# Patient Record
Sex: Female | Born: 1970 | Race: White | Hispanic: Yes | Marital: Married | Smoking: Never smoker
Health system: Southern US, Community
[De-identification: ages and names within clinical notes are randomized; demographics above are authoritative.]

## PROBLEM LIST (undated history)

## (undated) DIAGNOSIS — J45909 Unspecified asthma, uncomplicated: Secondary | ICD-10-CM

## (undated) DIAGNOSIS — M549 Dorsalgia, unspecified: Secondary | ICD-10-CM

## (undated) DIAGNOSIS — E119 Type 2 diabetes mellitus without complications: Secondary | ICD-10-CM

## (undated) DIAGNOSIS — I10 Essential (primary) hypertension: Secondary | ICD-10-CM

## (undated) HISTORY — PX: HERNIA REPAIR: SHX51

---

## 1998-12-13 ENCOUNTER — Emergency Department (HOSPITAL_COMMUNITY): Admission: EM | Admit: 1998-12-13 | Discharge: 1998-12-13 | Payer: Self-pay | Admitting: Emergency Medicine

## 1998-12-24 ENCOUNTER — Emergency Department (HOSPITAL_COMMUNITY): Admission: EM | Admit: 1998-12-24 | Discharge: 1998-12-24 | Payer: Self-pay | Admitting: Emergency Medicine

## 1999-02-22 ENCOUNTER — Ambulatory Visit: Admission: RE | Admit: 1999-02-22 | Discharge: 1999-02-22 | Payer: Self-pay | Admitting: *Deleted

## 1999-02-22 ENCOUNTER — Encounter: Payer: Self-pay | Admitting: *Deleted

## 1999-03-06 ENCOUNTER — Emergency Department (HOSPITAL_COMMUNITY): Admission: EM | Admit: 1999-03-06 | Discharge: 1999-03-07 | Payer: Self-pay | Admitting: Emergency Medicine

## 1999-03-06 ENCOUNTER — Encounter: Payer: Self-pay | Admitting: Emergency Medicine

## 1999-07-19 ENCOUNTER — Emergency Department (HOSPITAL_COMMUNITY): Admission: EM | Admit: 1999-07-19 | Discharge: 1999-07-19 | Payer: Self-pay | Admitting: *Deleted

## 1999-09-17 ENCOUNTER — Emergency Department (HOSPITAL_COMMUNITY): Admission: EM | Admit: 1999-09-17 | Discharge: 1999-09-17 | Payer: Self-pay | Admitting: Emergency Medicine

## 1999-09-18 ENCOUNTER — Encounter: Payer: Self-pay | Admitting: Emergency Medicine

## 2000-04-22 ENCOUNTER — Inpatient Hospital Stay (HOSPITAL_COMMUNITY): Admission: AD | Admit: 2000-04-22 | Discharge: 2000-04-22 | Payer: Self-pay | Admitting: Gynecology

## 2000-05-08 ENCOUNTER — Encounter: Payer: Self-pay | Admitting: *Deleted

## 2000-05-08 ENCOUNTER — Encounter: Payer: Self-pay | Admitting: Obstetrics

## 2000-05-08 ENCOUNTER — Inpatient Hospital Stay (HOSPITAL_COMMUNITY): Admission: AD | Admit: 2000-05-08 | Discharge: 2000-05-10 | Payer: Self-pay | Admitting: Gynecology

## 2000-05-14 ENCOUNTER — Other Ambulatory Visit: Admission: RE | Admit: 2000-05-14 | Discharge: 2000-05-14 | Payer: Self-pay | Admitting: Obstetrics

## 2000-06-11 ENCOUNTER — Other Ambulatory Visit: Admission: RE | Admit: 2000-06-11 | Discharge: 2000-06-11 | Payer: Self-pay | Admitting: Internal Medicine

## 2000-07-26 ENCOUNTER — Inpatient Hospital Stay (HOSPITAL_COMMUNITY): Admission: AD | Admit: 2000-07-26 | Discharge: 2000-07-26 | Payer: Self-pay | Admitting: Obstetrics & Gynecology

## 2000-09-08 ENCOUNTER — Inpatient Hospital Stay (HOSPITAL_COMMUNITY): Admission: AD | Admit: 2000-09-08 | Discharge: 2000-09-08 | Payer: Self-pay | Admitting: *Deleted

## 2000-09-12 ENCOUNTER — Inpatient Hospital Stay (HOSPITAL_COMMUNITY): Admission: AD | Admit: 2000-09-12 | Discharge: 2000-09-12 | Payer: Self-pay | Admitting: *Deleted

## 2000-10-17 ENCOUNTER — Inpatient Hospital Stay (HOSPITAL_COMMUNITY): Admission: AD | Admit: 2000-10-17 | Discharge: 2000-10-17 | Payer: Self-pay | Admitting: Gynecology

## 2000-10-18 ENCOUNTER — Inpatient Hospital Stay (HOSPITAL_COMMUNITY): Admission: AD | Admit: 2000-10-18 | Discharge: 2000-10-18 | Payer: Self-pay | Admitting: Internal Medicine

## 2000-10-25 ENCOUNTER — Inpatient Hospital Stay (HOSPITAL_COMMUNITY): Admission: AD | Admit: 2000-10-25 | Discharge: 2000-10-27 | Payer: Self-pay | Admitting: Gynecology

## 2000-11-30 ENCOUNTER — Encounter: Payer: Self-pay | Admitting: Emergency Medicine

## 2000-11-30 ENCOUNTER — Emergency Department (HOSPITAL_COMMUNITY): Admission: EM | Admit: 2000-11-30 | Discharge: 2000-11-30 | Payer: Self-pay | Admitting: Emergency Medicine

## 2000-12-17 ENCOUNTER — Other Ambulatory Visit: Admission: RE | Admit: 2000-12-17 | Discharge: 2000-12-17 | Payer: Self-pay | Admitting: Gynecology

## 2001-03-26 ENCOUNTER — Emergency Department (HOSPITAL_COMMUNITY): Admission: EM | Admit: 2001-03-26 | Discharge: 2001-03-26 | Payer: Self-pay | Admitting: Emergency Medicine

## 2001-03-26 ENCOUNTER — Encounter: Payer: Self-pay | Admitting: Emergency Medicine

## 2001-11-12 ENCOUNTER — Encounter: Admission: RE | Admit: 2001-11-12 | Discharge: 2001-11-12 | Payer: Self-pay | Admitting: Gynecology

## 2001-11-12 ENCOUNTER — Encounter: Payer: Self-pay | Admitting: Gynecology

## 2001-11-21 ENCOUNTER — Ambulatory Visit (HOSPITAL_COMMUNITY): Admission: RE | Admit: 2001-11-21 | Discharge: 2001-11-21 | Payer: Self-pay | Admitting: Gynecology

## 2001-11-21 ENCOUNTER — Encounter: Payer: Self-pay | Admitting: Gynecology

## 2002-01-31 ENCOUNTER — Emergency Department (HOSPITAL_COMMUNITY): Admission: EM | Admit: 2002-01-31 | Discharge: 2002-01-31 | Payer: Self-pay

## 2002-02-10 ENCOUNTER — Emergency Department (HOSPITAL_COMMUNITY): Admission: EM | Admit: 2002-02-10 | Discharge: 2002-02-11 | Payer: Self-pay | Admitting: Emergency Medicine

## 2002-02-11 ENCOUNTER — Encounter: Payer: Self-pay | Admitting: Emergency Medicine

## 2002-03-11 ENCOUNTER — Other Ambulatory Visit: Admission: RE | Admit: 2002-03-11 | Discharge: 2002-03-11 | Payer: Self-pay | Admitting: Gynecology

## 2002-11-05 ENCOUNTER — Other Ambulatory Visit: Admission: RE | Admit: 2002-11-05 | Discharge: 2002-11-05 | Payer: Self-pay | Admitting: Gynecology

## 2002-11-13 HISTORY — PX: TUBAL LIGATION: SHX77

## 2003-03-02 ENCOUNTER — Inpatient Hospital Stay (HOSPITAL_COMMUNITY): Admission: AD | Admit: 2003-03-02 | Discharge: 2003-03-02 | Payer: Self-pay | Admitting: Gynecology

## 2003-04-05 ENCOUNTER — Inpatient Hospital Stay (HOSPITAL_COMMUNITY): Admission: AD | Admit: 2003-04-05 | Discharge: 2003-04-06 | Payer: Self-pay | Admitting: Gynecology

## 2003-04-18 ENCOUNTER — Inpatient Hospital Stay (HOSPITAL_COMMUNITY): Admission: AD | Admit: 2003-04-18 | Discharge: 2003-04-20 | Payer: Self-pay | Admitting: Gynecology

## 2003-05-03 ENCOUNTER — Inpatient Hospital Stay (HOSPITAL_COMMUNITY): Admission: AD | Admit: 2003-05-03 | Discharge: 2003-05-05 | Payer: Self-pay | Admitting: Gynecology

## 2003-05-04 ENCOUNTER — Encounter (INDEPENDENT_AMBULATORY_CARE_PROVIDER_SITE_OTHER): Payer: Self-pay | Admitting: *Deleted

## 2003-07-14 ENCOUNTER — Other Ambulatory Visit: Admission: RE | Admit: 2003-07-14 | Discharge: 2003-07-14 | Payer: Self-pay | Admitting: Gynecology

## 2004-04-21 ENCOUNTER — Emergency Department (HOSPITAL_COMMUNITY): Admission: EM | Admit: 2004-04-21 | Discharge: 2004-04-21 | Payer: Self-pay | Admitting: Emergency Medicine

## 2005-04-14 ENCOUNTER — Emergency Department (HOSPITAL_COMMUNITY): Admission: EM | Admit: 2005-04-14 | Discharge: 2005-04-14 | Payer: Self-pay | Admitting: Emergency Medicine

## 2005-11-09 ENCOUNTER — Emergency Department (HOSPITAL_COMMUNITY): Admission: EM | Admit: 2005-11-09 | Discharge: 2005-11-10 | Payer: Self-pay | Admitting: Emergency Medicine

## 2006-02-02 ENCOUNTER — Emergency Department (HOSPITAL_COMMUNITY): Admission: EM | Admit: 2006-02-02 | Discharge: 2006-02-03 | Payer: Self-pay | Admitting: Emergency Medicine

## 2006-04-13 ENCOUNTER — Inpatient Hospital Stay (HOSPITAL_COMMUNITY): Admission: AD | Admit: 2006-04-13 | Discharge: 2006-04-14 | Payer: Self-pay | Admitting: Obstetrics and Gynecology

## 2007-02-27 ENCOUNTER — Emergency Department (HOSPITAL_COMMUNITY): Admission: EM | Admit: 2007-02-27 | Discharge: 2007-02-27 | Payer: Self-pay | Admitting: Emergency Medicine

## 2007-02-28 ENCOUNTER — Ambulatory Visit: Payer: Self-pay | Admitting: Vascular Surgery

## 2007-02-28 ENCOUNTER — Encounter: Payer: Self-pay | Admitting: Vascular Surgery

## 2007-02-28 ENCOUNTER — Ambulatory Visit (HOSPITAL_COMMUNITY): Admission: RE | Admit: 2007-02-28 | Discharge: 2007-02-28 | Payer: Self-pay | Admitting: Physician Assistant

## 2008-03-31 ENCOUNTER — Ambulatory Visit: Payer: Self-pay | Admitting: Family Medicine

## 2008-03-31 ENCOUNTER — Encounter (INDEPENDENT_AMBULATORY_CARE_PROVIDER_SITE_OTHER): Payer: Self-pay | Admitting: Family Medicine

## 2008-03-31 ENCOUNTER — Ambulatory Visit (HOSPITAL_COMMUNITY): Admission: RE | Admit: 2008-03-31 | Discharge: 2008-03-31 | Payer: Self-pay | Admitting: Family Medicine

## 2008-03-31 LAB — CONVERTED CEMR LAB
ALT: 18 units/L (ref 0–35)
BUN: 11 mg/dL (ref 6–23)
CO2: 27 meq/L (ref 19–32)
Calcium: 9.2 mg/dL (ref 8.4–10.5)
Chloride: 105 meq/L (ref 96–112)
Cholesterol: 223 mg/dL — ABNORMAL HIGH (ref 0–200)
Creatinine, Ser: 0.6 mg/dL (ref 0.40–1.20)
Eosinophils Relative: 5 % (ref 0–5)
HCT: 41.6 % (ref 36.0–46.0)
Hemoglobin: 13.3 g/dL (ref 12.0–15.0)
Lymphocytes Relative: 28 % (ref 12–46)
Lymphs Abs: 2.5 10*3/uL (ref 0.7–4.0)
Monocytes Absolute: 0.6 10*3/uL (ref 0.1–1.0)
Monocytes Relative: 6 % (ref 3–12)
RBC: 4.48 M/uL (ref 3.87–5.11)
Sed Rate: 18 mm/hr (ref 0–22)
Total CHOL/HDL Ratio: 4.1
WBC: 8.9 10*3/uL (ref 4.0–10.5)

## 2008-05-04 ENCOUNTER — Ambulatory Visit: Payer: Self-pay | Admitting: *Deleted

## 2008-05-04 ENCOUNTER — Ambulatory Visit: Payer: Self-pay | Admitting: Family Medicine

## 2009-09-06 ENCOUNTER — Encounter: Admission: RE | Admit: 2009-09-06 | Discharge: 2009-09-06 | Payer: Self-pay | Admitting: Specialist

## 2009-11-13 HISTORY — PX: FINGER SURGERY: SHX640

## 2010-05-18 ENCOUNTER — Emergency Department (HOSPITAL_COMMUNITY): Admission: EM | Admit: 2010-05-18 | Discharge: 2010-05-18 | Payer: Self-pay | Admitting: Emergency Medicine

## 2010-05-26 ENCOUNTER — Ambulatory Visit (HOSPITAL_COMMUNITY): Admission: RE | Admit: 2010-05-26 | Discharge: 2010-05-26 | Payer: Self-pay | Admitting: Orthopedic Surgery

## 2010-05-30 ENCOUNTER — Encounter: Admission: RE | Admit: 2010-05-30 | Discharge: 2010-05-30 | Payer: Self-pay | Admitting: Orthopedic Surgery

## 2010-08-25 ENCOUNTER — Ambulatory Visit: Payer: Self-pay | Admitting: Obstetrics and Gynecology

## 2010-08-25 ENCOUNTER — Inpatient Hospital Stay (HOSPITAL_COMMUNITY): Admission: AD | Admit: 2010-08-25 | Discharge: 2010-08-25 | Payer: Self-pay | Admitting: Obstetrics & Gynecology

## 2011-01-26 LAB — WET PREP, GENITAL
Clue Cells Wet Prep HPF POC: NONE SEEN
Trich, Wet Prep: NONE SEEN
Yeast Wet Prep HPF POC: NONE SEEN

## 2011-01-26 LAB — URINALYSIS, ROUTINE W REFLEX MICROSCOPIC
Bilirubin Urine: NEGATIVE
Nitrite: NEGATIVE
Specific Gravity, Urine: <1.005 — ABNORMAL LOW (ref 1.005–1.030)
pH: 6 (ref 5.0–8.0)

## 2011-01-26 LAB — GC/CHLAMYDIA PROBE AMP, GENITAL
Chlamydia, DNA Probe: NEGATIVE
GC Probe Amp, Genital: NEGATIVE

## 2011-01-26 LAB — POCT PREGNANCY, URINE: Preg Test, Ur: NEGATIVE

## 2011-03-31 NOTE — Discharge Summary (Signed)
   NAME:  [name], [name]                       ACCOUNT NO.:  [id number]   MEDICAL RECORD NO.:  [id number]                   PATIENT TYPE:  INP   LOCATION:  9152                                 FACILITY:  [facility]   PHYSICIAN:  Timothy P. Fontaine, M.D.           DATE OF BIRTH:  1971/01/08   DATE OF ADMISSION:  04/05/2003  DATE OF DISCHARGE:  04/06/2003                                 DISCHARGE SUMMARY   DISCHARGE DIAGNOSES:  1. Intrauterine pregnancy at 33+ weeks, undelivered.  2. Preterm contractions.   HISTORY:  This is a 40 years of age female, gravida 8, para 3, with a  history of preterm labor and preterm contractions.  During pregnancy, she  was already placed on p.o. terbutaline.   HOSPITAL COURSE:  On Apr 05, 2003, the patient was admitted at 33 weeks.  Already on p.o. terbutaline with contractions q.10-12 minutes.  Cervix is  fingertip, long and high.  Admitted for IV hydration, continuation of oral  terbutaline with observation.  Throughout the remainder of her evening,  contractions resolved with IV fluids and oral terbutaline 2.5 q.4 h.   DISPOSITION:  The patient was discharged to home in stable and satisfactory  condition on terbutaline 2.5 q.4 h.  She is to followup in the office in 48  hours at her scheduled appointment.  Strongly advised to stop working at  this point.  If any problems prior to that time, to let us know.     Leonidas Romberg., [place], P.A.-C                 Nadyne Coombes. Audie Box, M.D.    Ardath Sax  D:  05/15/2003  T:  05/15/2003  Job:  [id number]

## 2011-03-31 NOTE — Op Note (Signed)
NAME:  [name], [name]                       ACCOUNT NO.:  [id number]   MEDICAL RECORD NO.:  [id number]                   PATIENT TYPE:  INP   LOCATION:  9130                                 [facility]ACILITY[facility]   PHYSICIAN:  Juan H. Lily Peer, M.D.             DATE OF BIRTH:  05-06-1971   DATE OF PROCEDURE:  05/04/2003  DATE OF DISCHARGE:                                 OPERATIVE REPORT   PREOPERATIVE DIAGNOSIS:  Request for immediate postpartum tubal  sterilization.   POSTOPERATIVE DIAGNOSIS:  Request for immediate postpartum tubal  sterilization.   PROCEDURE PERFORMED:  Postpartum tubal sterilization procedure, Pomeroy  technique.   ANESTHESIA:  Epidural along with 0.25% Marcaine infiltrated subcutaneously.   INDICATION FOR OPERATION:  A 40 year old gravida 8, para 6, AB 2, who had  been counseled preoperatively as well as during her prenatal visits for  postpartum tubal sterilization.  The appropriate Medicaid forms were filled  out with the appropriate timetable.  The patient was counseled as to risks,  benefits, pros and cons, and fully aware of this being a permanent  procedure.  The following instructions had also been provided in Spanish as  well.   FINDINGS:  Patient with slightly inflamed right fallopian tube with  peritubal adhesions to the left tube.  Normal size with some peritubal  adhesions.   DESCRIPTION OF OPERATION:  After the patient was adequately counseled, she  was taken to the operating room, where she was redosed through her epidural.  The abdomen was prepped and draped in the usual sterile fashion.  Marcaine  0.25% for 10 mL was infiltrated in a subumbilical incision where the  incision was to be made.  A semilunar incision was made from the skin and  subcutaneous tissue down to the rectus fascia.  The rectus fascia was  incised, the peritoneal cavity was entered, and two [employer] retractors  were utilized for exposure along with the Deaver.  The left  tube was brought  into view with a weighted retractor and some of the peritubal adhesions had  to be freed in order to visualize the fallopian tube and was completely  identified from its proximal to its distal end.  Then the proximal portion  was grasped with a Babcock clamp and a 2 cm segment was tied with 3-0 Vicryl  suture x2 and a 2 cm segment was excised, passed off the operative field,  and the remaining stumps Bovie cauterized.  A similar procedure was carried  out on the contralateral side.  The sponge count and needle count were  correct.  The fascia was closed with a running stitch of 0 Vicryl suture and  the subcutaneous tissue was reapproximated with 3-0 Vicryl suture, and the  skin was reapproximated with subcuticular stitch of 4-0 plain catgut suture.  The patient was transferred to the recovery room with stable vital signs.  She did receive a gram of Cefotan for prophylaxis.  IV fluid total was 1100  mL of lactated Ringer's, and blood loss was minimal.                                               Juan H. Lily Peer, M.D.    JHF/MEDQ  D:  05/04/2003  T:  05/04/2003  Job:  [id number]

## 2011-03-31 NOTE — Discharge Summary (Signed)
[facility]  Patient:    [name], [name]                    MRN: [id number] Adm. Date:  [id number] Attending:  Tammi Sou Dictator:   Kinnie Scales. Reed Breech, M.D. CC:         Dr. Magnus Ivan                           Discharge Summary  DATE OF BIRTH:                01/21/1971.  DISCHARGE DIAGNOSES:          1. Right lower quadrant pain, rule out ___.                               2. Dehydration.                               3. A 13-week gravid female.  DISCHARGE MEDS:               1. Tylenol No. 3 one p.o. q.6h. p.r.n. pain.                               2. Prenatal vitamins one p.o. q.d.                               3. Tylenol 325 mg one to two p.o. q.4-6h. p.r.n.                                  pain.  PROCEDURES:                   Abdominal CT with p.o. contrast, no IV contrast, unable to demonstrate appendicitis or periappendicular inflammation.  No distended loops of bowel.  Essentially negative.  OB ultrasound performed. Please see ultrasound worksheet.  CONSULTATIONS:                Dr. Magnus Ivan, surgery.  ADMISSION HISTORY & PHYSICAL: This Latino 40 year old G4, P3-0-0-3 at 79 weeks estimated gestational age presented with onset of right lower quadrant pain around four in the afternoon.  The pain is intermittent, comes and goes, and also associated with right leg pain.  She also reports a headache and blurred vision.  She has been vomiting x 2.  She reports to have waves of nausea and decreased appetite.  She arrived via EMS.  The patient does have significant history of having appendix diagnosed in [place] and was treated with p.o. antibiotics.  PHYSICAL EXAMINATION:         She has a soft abdomen with positive bowel sounds. Abdomen is tender with palpation of right lower quadrant.  There is no guarding, rebound or psoas sign.  Wet prep showed few clue cells.  UA was negative.  CBC had a nonelevated wbc, hemoglobin  11.9.  PLAN:                         The patient is was admitted for evaluation of her abdominal pain.  HOSPITAL COURSE:              #  1 - ABDOMINAL PAIN.  Dr. Magnus Ivan and family practice resident evaluated the patient and found her pain to be more superficial than appendix plus her abdomen was nonacute.  CT did not demonstrate small-bowel obstruction or appendicitis.  The patient was having flatus and BM.  The patient also reported some paresthesias down her leg associated with this abdominal pain and there was a question of a neurologic component to this as well.  The patients abdominal pain had resolved by the day of discharge.  She is stable and ambulating.  She is having good bowel movements.                                #2 - FEN.  The patient did have some orthostatic symptoms and was pronounced as dizziness. She was given lactated Ringers fluid bolus. By the day of discharge these symptoms had resolved.  DISCHARGE HISTORY & PHYSICAL: The patient doing well today.  She wants to go home.  She said she is eating, getting up and moving around in a normal fashion.  She does ask for some stronger Tylenol to help with the pain over the next several days but otherwise will use Tylenol regular strength.  FOLLOW-UP:                    The patient is to follow her regular OB appointment with [facility] in the next week or so. DD:  05/10/00 TD:  05/10/00 Job: [id number]

## 2011-03-31 NOTE — H&P (Signed)
NAME:  [name], [name]                       ACCOUNT NO.:  [id number]   MEDICAL RECORD NO.:  [id number]                   PATIENT TYPE:  INP   LOCATION:  9156                                 [facility]ACILITY:  [facility]   PHYSICIAN:  Timothy P. Fontaine, M.D.           DATE OF BIRTH:  1971/09/19   DATE OF ADMISSION:  04/18/2003  DATE OF DISCHARGE:                                HISTORY & PHYSICAL   CHIEF COMPLAINT:  Contractions.   HISTORY OF PRESENT ILLNESS:  This is a 40 year old G8, P5 female at 34-5/7  weeks admitted with contractions. The patient has a history of preterm labor  for which she is on oral terbutaline 2.5 q.4 h. with last office check at 1  cm dilatation. The patient noted today, increasing contractions on a  frequent basis and she was instructed to present to triage. On triage  evaluation, she was contracting q.5-12 minutes. She was admitted for IV  hydration and continued terbutaline therapy. The patient does have a history  of a positive beta Streptococcus in the past.   PAST OBSTETRICAL HISTORY:  Significant for preterm rupture of the membranes  at 35 weeks with one of her pregnancies. The remainder delivered at 40  weeks.   PAST MEDICAL HISTORY:  Unremarkable.   PAST SURGICAL HISTORY:  Unremarkable.   ALLERGIES:  None.   REVIEW OF SYSTEMS:  Noncontributory.   SOCIAL HISTORY:  Noncontributory.   FAMILY HISTORY:  Noncontributory.   PHYSICAL EXAMINATION:  VITAL SIGNS:  Afebrile. Vital signs are stable.  HEENT:  Normal.  LUNGS:  Clear.  CARDIAC:  Regular rate without murmurs, rubs, or gallops.  ABDOMEN:  Gravid, vertex, appropriate for 34-35 week fundal height. Uterus  is soft, nontender. External monitor shows reactive fetal tracing with  contractions q.2-4 minutes.  PELVIC:  Shows cervix to be 1-2 cm, 40%, -2 station, vertex presentation.   ASSESSMENT:  This is a 40 year old G8, P5 with history of preterm labor, on  oral terbutaline, now with regular  contractions despite intravenous  hydration and continued terbutaline tocolysis. The patient is 55, almost 35  weeks. The issues of more aggressive tocolysis versus allowing delivery were  reviewed. Given the marginal gestational age, will air on conservative side  and proceed with magnesium tocolysis. Will plan full g bolus, 2 g a hour  maintenance. Discontinue the oral terbutaline. Treat with Unasyn 1.5 g q.6  h. Cover for Streptococcus. Check urinalysis, CBC, baseline.   Assuming the patient receives tocolysis, then will consider switching to  Nifedipine for oral dosage. If breakthrough, then will proceed with  delivery. Given gestational age at 49, almost 35 weeks, will hold on  steroids at this time. The patient's questions were answered to her  satisfaction with her husband present.  Timothy P. Audie Box, M.D.    TPF/MEDQ  D:  04/18/2003  T:  04/18/2003  Job:  [id number]

## 2011-03-31 NOTE — Discharge Summary (Signed)
[facility]  Patient:    [name], [name]                    MRN: [id number] Adm. Date:  [id number] Disc. Date: [id number] Attending:  Merrily Pew Dictator:   Antony Contras, [facility]                           Discharge Summary  DISCHARGE DIAGNOSES:          1. Intrauterine pregnancy at 38 weeks.                               2. History of positive Group B Streptococci.  PROCEDURES:                   Normal spontaneous vaginal delivery of                               viable infant over intact perineum with no                               lacerations.  HISTORY OF PRESENT ILLNESS:   Patient is a 40 year old gravida 7, para 4-0-2-4 with an LMP February 01, 2000 and [facility] November 09, 2000. Prenatal risk factors include a history of premature rupture of membranes at 35 weeks with her last pregnancy. Also, history of a positive GBS culture with this pregnancy.  PRENATAL LABORATORY DATA:     Blood type A positive, antibody screen negative. RPR, HBSAG, and HIV nonreactive. Rubella immune. Normal MS-AFP. Positive GBS.  HOSPITAL COURSE/TREATMENT:    Patient was admitted on October 25, 2000 with spontaneous rupture of membranes and regular uterine contractions. Cervical dilatation at this time was 4 cm, completely effaced, and 0 station. Patient was given GBS prophylaxis and her labor progressed to complete dilatation. She delivered an Apgar 8/9, female infant weighing 7 pounds 3 ounces over intact perineum with no lacerations. Postpartum she had a temperature maximum of 100 on her first postpartum day, which did resolve.  POSTPARTUM LABORATORY DATA:     CBC: Hematocrit 29.5, hemoglobin 10.2. WBCs 15.2. Platelets 238.  DISPOSITION:  She was able to be discharged on her second postpartum day in satisfactory condition.  DISCHARGE PLANS:                  Follow up in six weeks in the office. Continue with Motrin and Tylox for pain, prenatal vitamins  and iron. DD:  11/26/00 TD:  11/26/00 Job: [id number]

## 2011-03-31 NOTE — H&P (Signed)
   NAME:  [name], [name]                       ACCOUNT NO.:  [id number]   MEDICAL RECORD NO.:  [id number]                   PATIENT TYPE:  INP   LOCATION:  9152                                 [facility]   PHYSICIAN:  Timothy P. Fontaine, M.D.           DATE OF BIRTH:  [birth year]   DATE OF ADMISSION:  04/05/2003  DATE OF DISCHARGE:                                HISTORY & PHYSICAL   CHIEF COMPLAINT:  Contractions.  Pregnancy at 33+ weeks.   HISTORY OF PRESENT ILLNESS:  40 year old G8, P5 female at [redacted] weeks gestation  admitted with contractions approximately every 10 to 12 minutes.  The  patient has a history of preterm labor and preterm contractions.  She is on  p.o. terbutaline, with last dose approximately five hours ago, which she was  taking 2.5 mg terbutaline every four hours.  Exam shows her cervix to be a  fingertip, long, and high.  She was admitted for IV hydration and  continuation of her oral terbutaline with observation.  Throughout the  remainder of the evening, her contractions resolved.  Admission urinalysis  was negative.  A beta strep culture was pending.  For the remainder of her  history, see her [place].   EXAM:  Afebrile.  VITAL SIGNS:  Stable.  HEENT:  Normal.  LUNGS:  Clear.  CARDIAC:  Regular rate without rubs, murmurs, or gallops.  ABDOMEN:  Benign.  Uterus is appropriate for dates, soft, nontender.  External monitor shows a reactive fetus without contractions.  GU:  Cervix is long, fingertip, and high.  No evidence of rupture of  membranes or bleeding.   ASSESSMENT:  41 year old G8, P5 female, preterm contractions on oral  terbutaline, contractions every 10 to 12 minutes, admitted for IV hydration,  rest, monitoring, and contractions resolved on their own.  The patient is  working at this time and I have strongly advised her to stop working.  She  will continue on her oral terbutaline 2.5 q. 4h and follow up in the office  in 48 hours at her  scheduled appointment.                                               Timothy P. Audie Box, M.D.    TPF/MEDQ  D:  04/06/2003  T:  04/06/2003  Job:  [id number]

## 2011-03-31 NOTE — Discharge Summary (Signed)
   NAME:  [name], [name]                       ACCOUNT NO.:  [id number]   MEDICAL RECORD NO.:  [id number]                   PATIENT TYPE:  INP   LOCATION:  9130                                 [facility]   PHYSICIAN:  Timothy P. Fontaine, M.D.           DATE OF BIRTH:  [birth year]   DATE OF ADMISSION:  05/03/2003  DATE OF DISCHARGE:  05/05/2003                                 DISCHARGE SUMMARY   DISCHARGE DIAGNOSES:  1. Intrauterine pregnancy at 37 weeks, delivered.  2. History of preterm labor.  3. History of positive group B strep.  4. Desire at attempted permanent sterilization.  5. Status post spontaneous vaginal delivery.  6. Status post postpartum tubal sterilization procedure Pomeroy technique by     Dr. Reynaldo Minium on May 04, 2003.   HISTORY OF PRESENT ILLNESS:  This is a 40 year old female gravida 8, para 5,  abort of 2 with an EDC of May 24, 2003.  Prenatal course has been  complicated by history of preterm labor this pregnancy, was on terbutaline,  also history of positive group B strep.  Desire at attempted permanent  sterilization.   HOSPITAL COURSE:  On May 03, 2003, the patient was admitted at 37 weeks in  labor.  She was begun on  IV penicillin G for group B strep prophylaxis.  She underwent artificial rupture of membranes.  Labor was augmented with  Pitocin and subsequently on May 03, 2003, at 1912, the patient underwent a  spontaneous vaginal delivery of a female with Apgars of 9 and 9, weight 6  pounds 3 ounces.  There was on episiotomy.  There were no complications.   Postpartum, the patient did remain afebrile, voiding, and in stable  condition.  On May 04, 2003, she underwent a postpartum tubal sterilization  procedure, Pomeroy technique, by Dr. Reynaldo Minium without complications.  It was noted that the patient had slightly inflamed right fallopian tube  with peritubal adhesions to the left tube and she received a gram of Ceftin  for  prophylaxis.  Postoperatively again, the patient did well.  She did not  pass any flatus post tubal ligation and prior to discharge.   On May 05, 2003, the patient was given a Fleets enema and then felt stable  for discharge.   LABORATORY DATA:  The patient is A positive, rubella immune.   DISPOSITION:  The patient is discharged to home, informed to return to our  office in six weeks and __________ office.  She was given a prescription for  Tylox p.r.n. for pain.     Susa Loffler, P.A.                    Timothy P. Fontaine, M.D.    TSG/MEDQ  D:  05/27/2003  T:  05/28/2003  Job:  [id number]

## 2011-03-31 NOTE — Discharge Summary (Signed)
   NAME:  [name], [name]                       ACCOUNT NO.:  [id number]   MEDICAL RECORD NO.:  [id number]                   PATIENT TYPE:  INP   LOCATION:  9156                                 [facility]ACILITY[facility]   PHYSICIAN:  Ivor Costa. Farrel Gobble, M.D.              DATE OF BIRTH:  Apr 27, 1971   DATE OF ADMISSION:  04/18/2003  DATE OF DISCHARGE:  04/20/2003                                 DISCHARGE SUMMARY   DISCHARGE DIAGNOSES:  1. Intrauterine pregnancy at 34 weeks and five days, delivered.  2. Preterm labor.  3. History of positive group B Streptococcus.   HISTORY OF PRESENT ILLNESS:  A 40 year old female, gravida 8, para 5 whose  prenatal course had been complicated prior to admission by preterm labor,  which she had been placed on oral terbutaline 2.5 mg p.o. q.4h.  With the  last office check prior to this admission she was 1 cm dilated.   HOSPITAL COURSE:  On April 18, 2003, the patient presented at 34 weeks and  five days with increasing contractions on a frequent basis and was  contracting every five to 12 minutes.  She was admitted for IV hydration and  continuing terbutaline therapy.  There was found to be 1 to 2 cm, 40%, -2  vertex.  Even after IV hydration, however, and continued terbutaline  tocolysis, the patient continued with regular contractions.  Therefore, the  patient was given magnesium tocolysis.  Oral terbutaline was discontinued, 4  g bolus, 2 g per hour maintenance and begun on IV Unasyn 1.5 g q.6h.   On April 19, 2003, the patient was continued on magnesium sulfate but this  patient did report contractions less but still felt more painful when she  had them.   On April 20, 2003, the patient noted contractions, reactive fetus and the  patient was then weaned off magnesium and changed to Procardia.  On April 20, 2003, the patient felt better.  Cervix was 2, 50% __________  and the  patient felt, at that point, stable for discharge.   __________  on April 18, 2003,  hemoglobin 11.2.  On April 18, 2003, urinalysis  was within normal limits.   DISPOSITION:  The patient was discharged to home.   FOLLOW UP:  She is to follow up in the office on Friday.   DISCHARGE MEDICATIONS:  She was given a prescription for Procardia 10 mg one  p.o. q.6h. #50.     Susa Loffler, P.A.                    Ivor Costa. Farrel Gobble, M.D.    TSG/MEDQ  D:  05/27/2003  T:  05/27/2003  Job:  [id number]

## 2011-03-31 NOTE — H&P (Signed)
NAME:  [name], [name]                       ACCOUNT NO.:  [id number]   MEDICAL RECORD NO.:  [id number]                   PATIENT TYPE:  INP   LOCATION:  9164                                 FACILITY:  [facility]   PHYSICIAN:  Juan H. Lily Peer, M.D.             DATE OF BIRTH:  1971-04-27   DATE OF ADMISSION:  05/03/2003  DATE OF DISCHARGE:                                HISTORY & PHYSICAL   CHIEF COMPLAINT:  Contractions.   HISTORY OF PRESENT ILLNESS:  The patient is a 40 year old gravida 8, para 5,  AB 2, with corrected estimated date of confinement May 24, 2003.  Currently  at 37 weeks' gestation.  Has had a history of preterm premature rupture of  membranes with the last pregnancy at 35 weeks.  This pregnancy she had  preterm labor and had been on terbutaline and recently had been switched to  nifedipine 10 mg q.6h.  She presented to [facility] today complaining  of increased frequency of contractions without having taking her oral  tocolytic.  Her contractions were reported to be every six to eight minutes  apart.  When she was put on the monitor, she was indeed contracting every  four to six minutes with a reassuring fetal heart rate tracing and her  cervix was about 3 cm, and she was admitted and ambulated on the ward.  Once  on the ward, her cervix was found to be 3 to 4 cm, about 80 to 90% effaced,  -3 station, and underwent artificial rupture of membranes with clear  amniotic fluid.  The patient's past pregnancy history with positive Group B  Strep, and will be started on Pen-G immediately.  The patient also had  requested immediate postpartum tubal ligation for which she had been  counseled in the office before, and we will go ahead and try to comply with  her wishes.  She also wished to have an epidural and we placed that as well.  The patient had during this pregnancy an upper respiratory tract infection  for which she was treated.  She also had bacterial vaginosis  for which she  was treated as well.   ALLERGIES:  No known drug allergies.   PAST MEDICAL HISTORY:  1. She has had four normal spontaneous vaginal deliveries at [redacted] weeks     gestation, and one vaginal delivery at 35 weeks as a result of preterm     premature rupture of membranes.  2. She has had one therapeutic abortion in 1996.  3. Spontaneous abortion later in that year as well.   She denies any other medical problems.   REVIEW OF SYSTEMS:  See hospital form.   PHYSICAL EXAMINATION:  VITAL SIGNS:  Stable, afebrile.  HEENT:  Unremarkable.  NECK:  Supple, trachea midline, no carotid bruits, no thyromegaly.  LUNGS:  Clear to auscultation without any rhonchi or wheezes.  HEART:  Regular rate and rhythm, no  murmurs or gallops.  BREASTS:  Not done.  ABDOMEN:  Gravid uterus, vertex presentation by [employer].  Positive  fetal heart tones.  PELVIC:  Dilated 2 to 4 cm, 80% to 90% effaced, -3 station.  EXTREMITIES:  Deep tendon reflexes are 1+.  Negative clonus, trace edema.   LABORATORY DATA:  A positive blood type, negative antibody screen.  VDRL was  nonreactive.  Rubella immune.  Hepatitis B surface antigen and HIV were  negative.  Alpha fetoprotein was normal.  Pap smear was normal.  Positive  history of Group B Strep in the past.   ASSESSMENT:  A 40 year old gravida 8, para 5, AB 2, at 10 weeks' gestation  with advanced cervical dilatation and in labor, had undergone artificial  rupture of membranes here in the hospital with clear amniotic fluid.  Fetal  scalp electrode and IUPC were placed.  The patient will be started on Pen-G  5 million units IV followed by 2.5 million units q.4h. as a result of  positive Group B Strep status in the past.  After her epidural is on board,  we will go ahead and augment her contractions to put her in an adequate  labor pattern.  We will also make arrangements for the operating room to try  to comply with her wishes based on scheduling to  try to do her postpartum  tubal ligation tomorrow.  The patient previously had been counseled on the  risks, benefits, pros and cons, and failure rate of tubal sterilization, and  she is fully aware that this is a permanent procedure.   PLAN:  As per assessment above.  All the above was discussed with the  patient in Spanish and her husband.  All questions were answered, and we  will follow accordingly.                                               Juan H. Lily Peer, M.D.    JHF/MEDQ  D:  05/03/2003  T:  05/03/2003  Job:  [id number]

## 2011-05-14 ENCOUNTER — Emergency Department (HOSPITAL_COMMUNITY): Payer: Worker's Compensation

## 2011-05-14 ENCOUNTER — Emergency Department (HOSPITAL_COMMUNITY)
Admission: EM | Admit: 2011-05-14 | Discharge: 2011-05-14 | Disposition: A | Discharge status: Home / Self Care | Payer: Worker's Compensation | Attending: Emergency Medicine | Admitting: Emergency Medicine

## 2011-05-14 DIAGNOSIS — M79609 Pain in unspecified limb: Secondary | ICD-10-CM | POA: Insufficient documentation

## 2011-05-14 DIAGNOSIS — S6990XA Unspecified injury of unspecified wrist, hand and finger(s), initial encounter: Secondary | ICD-10-CM | POA: Insufficient documentation

## 2011-05-14 DIAGNOSIS — S6980XA Other specified injuries of unspecified wrist, hand and finger(s), initial encounter: Secondary | ICD-10-CM | POA: Insufficient documentation

## 2011-05-14 DIAGNOSIS — Y9229 Other specified public building as the place of occurrence of the external cause: Secondary | ICD-10-CM | POA: Insufficient documentation

## 2011-05-14 DIAGNOSIS — W230XXA Caught, crushed, jammed, or pinched between moving objects, initial encounter: Secondary | ICD-10-CM | POA: Insufficient documentation

## 2011-05-24 ENCOUNTER — Emergency Department (HOSPITAL_COMMUNITY)
Admission: EM | Admit: 2011-05-24 | Discharge: 2011-05-25 | Disposition: A | Discharge status: Home / Self Care | Payer: Worker's Compensation | Attending: Emergency Medicine | Admitting: Emergency Medicine

## 2011-05-24 DIAGNOSIS — E7800 Pure hypercholesterolemia, unspecified: Secondary | ICD-10-CM | POA: Insufficient documentation

## 2011-05-24 DIAGNOSIS — L03019 Cellulitis of unspecified finger: Secondary | ICD-10-CM | POA: Insufficient documentation

## 2011-05-24 DIAGNOSIS — L02519 Cutaneous abscess of unspecified hand: Secondary | ICD-10-CM | POA: Insufficient documentation

## 2011-05-24 DIAGNOSIS — M79609 Pain in unspecified limb: Secondary | ICD-10-CM | POA: Insufficient documentation

## 2011-05-24 DIAGNOSIS — R229 Localized swelling, mass and lump, unspecified: Secondary | ICD-10-CM | POA: Insufficient documentation

## 2011-05-24 LAB — POCT I-STAT, CHEM 8
Calcium, Ion: 0.89 mmol/L — ABNORMAL LOW (ref 1.12–1.32)
Glucose, Bld: 82 mg/dL (ref 70–99)
HCT: 40 % (ref 36.0–46.0)
TCO2: 26 mmol/L (ref 0–100)

## 2011-05-24 LAB — CBC
MCH: 30.1 pg (ref 26.0–34.0)
MCV: 88.3 fL (ref 78.0–100.0)
Platelets: 326 10*3/uL (ref 150–400)
RDW: 13.4 % (ref 11.5–15.5)

## 2011-05-24 LAB — DIFFERENTIAL
Eosinophils Absolute: 0.2 10*3/uL (ref 0.0–0.7)
Eosinophils Relative: 2 % (ref 0–5)
Lymphs Abs: 3.4 10*3/uL (ref 0.7–4.0)
Monocytes Absolute: 0.8 10*3/uL (ref 0.1–1.0)
Monocytes Relative: 7 % (ref 3–12)

## 2011-05-26 ENCOUNTER — Inpatient Hospital Stay (HOSPITAL_COMMUNITY)
Admission: RE | Admit: 2011-05-26 | Discharge: 2011-05-28 | DRG: 603 | Disposition: A | Discharge status: Home / Self Care | Payer: Worker's Compensation | Source: Ambulatory Visit | Attending: Orthopedic Surgery | Admitting: Orthopedic Surgery

## 2011-05-26 DIAGNOSIS — E785 Hyperlipidemia, unspecified: Secondary | ICD-10-CM | POA: Diagnosis present

## 2011-05-26 DIAGNOSIS — L089 Local infection of the skin and subcutaneous tissue, unspecified: Principal | ICD-10-CM | POA: Diagnosis present

## 2011-05-26 LAB — COMPREHENSIVE METABOLIC PANEL
BUN: 14 mg/dL (ref 6–23)
CO2: 29 mEq/L (ref 19–32)
Chloride: 104 mEq/L (ref 96–112)
Creatinine, Ser: 0.55 mg/dL (ref 0.50–1.10)
GFR calc Af Amer: >60 mL/min (ref >60)
GFR calc non Af Amer: >60 mL/min (ref >60)
Glucose, Bld: 78 mg/dL (ref 70–99)
Total Bilirubin: 0.2 mg/dL — ABNORMAL LOW (ref 0.3–1.2)

## 2011-05-26 LAB — DIFFERENTIAL

## 2011-05-26 LAB — CBC
Hemoglobin: 13.3 g/dL (ref 12.0–15.0)
MCH: 30.7 pg (ref 26.0–34.0)
MCHC: 34.5 g/dL (ref 30.0–36.0)
MCV: 88.9 fL (ref 78.0–100.0)
RBC: 4.33 MIL/uL (ref 3.87–5.11)

## 2011-05-26 LAB — SURGICAL PCR SCREEN
MRSA, PCR: NEGATIVE
Staphylococcus aureus: NEGATIVE

## 2011-05-26 LAB — GLUCOSE, CAPILLARY: Glucose-Capillary: 81 mg/dL (ref 70–99)

## 2011-05-27 DIAGNOSIS — L089 Local infection of the skin and subcutaneous tissue, unspecified: Secondary | ICD-10-CM

## 2011-05-27 LAB — GLUCOSE, CAPILLARY

## 2011-05-27 LAB — CBC
HCT: 36.2 % (ref 36.0–46.0)
MCHC: 33.1 g/dL (ref 30.0–36.0)
Platelets: 312 10*3/uL (ref 150–400)
RDW: 13.7 % (ref 11.5–15.5)

## 2011-05-27 LAB — BASIC METABOLIC PANEL
BUN: 8 mg/dL (ref 6–23)
GFR calc Af Amer: >60 mL/min (ref >60)
GFR calc non Af Amer: >60 mL/min (ref >60)
Potassium: 3.3 mEq/L — ABNORMAL LOW (ref 3.5–5.1)
Sodium: 137 mEq/L (ref 135–145)

## 2011-05-28 LAB — CBC
HCT: 36.8 % (ref 36.0–46.0)
Hemoglobin: 12.2 g/dL (ref 12.0–15.0)
MCH: 29.8 pg (ref 26.0–34.0)
MCHC: 33.2 g/dL (ref 30.0–36.0)

## 2011-05-28 LAB — BASIC METABOLIC PANEL
BUN: 8 mg/dL (ref 6–23)
CO2: 30 mEq/L (ref 19–32)
Chloride: 102 mEq/L (ref 96–112)
Glucose, Bld: 94 mg/dL (ref 70–99)
Potassium: 3.9 mEq/L (ref 3.5–5.1)

## 2011-05-28 LAB — CK TOTAL AND CKMB (NOT AT ARMC): Total CK: 44 U/L (ref 7–177)

## 2011-05-29 LAB — WOUND CULTURE

## 2011-06-01 LAB — ANAEROBIC CULTURE

## 2011-06-02 NOTE — Discharge Summary (Signed)
  NAMEVERLISA, VARA             ACCOUNT NO.:  [id number]  MEDICAL RECORD NO.:  [id number]  LOCATION:  5033                         FACILITY:  [facility]  PHYSICIAN:  Betha Loa, MD        DATE OF BIRTH:  [birth year]  DATE OF ADMISSION:  05/26/2011 DATE OF DISCHARGE:  05/28/2011                              DISCHARGE SUMMARY   FINAL DIAGNOSIS:  Right small finger spider bite.  PROCEDURES:  Right small finger proximal interphalangeal joint irrigation and debridement.  CONSULTS:  Internal Medicine for medical management and Dr. Orvan Falconer with Infectious Disease for spider bite.  HISTORY:  [name] is a 40 year old Hispanic female who states she was bitten on the right small finger by a shiny black spider with a red dot on its belly on May 18, 2011.  She had numbness in her mouth and shortness of breath initially.  She had swelling and deformity in the finger.  She was seen by her primary physician and was sent to the emergency department on May 24, 2011, for evaluation.  A MRI had been obtained.  She was kept overnight for IV antibiotics.  She had done well and was discharged home.  She followed up in the office on May 26, 2011, and was complaining of continued pain in the finger with swelling of the finger.  Review of her MRI showed a fluid collection dorsal to the PIP joint.  I discussed with [name] the potential for infection with any type of bite.  We discussed continued nonoperative treatment with antibiotics and observation versus irrigation and debridement of the finger.  She wished to proceed with irrigation and debridement and I think this is reasonable.  Risks, benefits, and alternatives of surgery were discussed including the risks of blood loss, infection, damage to nerves, vessels, tendons, ligaments, and bone, failure of procedure, and need for additional procedures, complications with wound healing, continued pain, stiffness, and continued infection.  She  voiced understanding these risks and elected to proceed.  HOSPITAL COURSE:  [name] was taken to the operating room on May 26, 2011, for irrigation and debridement of the right small finger PIP joint.  Consent had been obtained by hospital interpreter.  No gross purulence was found, but there was a fluid collection dorsal to the PIP joint.  The wound was irrigated and packed open.  She was kept for IV antibiotics.  Hospitalist were consulted for management of medical issues and Dr. Orvan Falconer of Infectious Disease was consulted for management of spider bite issues.  Her white blood count went from 15.6 on the day of admission to 10.6 on the day of discharge.  She was afebrile throughout her stay.  Cultures remained negative.  It was felt she was safe to discharge home on oral antibiotics including Augmentin and doxycycline on May 28, 2011.  She will follow up in the office tomorrow for hydrotherapy and follow up with myself or Dr. Mina Marble in the next 2-5 days.    Betha Loa, MD    KK/MEDQ  D:  05/28/2011  T:  05/29/2011  Job:  [id number]  Electronically Signed by Betha Loa  on 06/02/2011 03:56:29 PM

## 2011-06-02 NOTE — H&P (Signed)
[name], Dickson             ACCOUNT NO.:  [id number]  MEDICAL RECORD NO.:  [id number]  LOCATION:  5033                         FACILITY:  [facility]  PHYSICIAN:  Betha Loa, MD        DATE OF BIRTH:  23-Jan-1971  DATE OF ADMISSION:  05/26/2011 DATE OF DISCHARGE:                             HISTORY & PHYSICAL   CHIEF COMPLAINT:  Spider bite right small finger.  HISTORY:  [name] is a 40 year old Hispanic female who states that on May 18, 2011, while at work she was bit on the dorsum of her right small finger at the PIP joint by a black spider with a red spot on its belly.  She had immediate swelling in the finger.  She had a numb feeling in her mouth.  She was seen by primary care.  She had initially fevers and feelings of heart racing.  This has subsided.  She has continued pain in the right small finger.  An MRI was ordered showing a collection of fluid in the dorsal aspect of the finger at the PIP joint, as well as destruction of the tendon.  She was seen by Dr. Mina Marble 2 days ago and kept for observation and IV antibiotics.  She presented to the office this morning and had continued pain at the finger with increased flexion deformity at the PIP joint.  She was exquisitely tender to palpation at the dorsum of the PIP joint of the small finger. There is a small amount of fluctuance noted.  There was minimal erythema and no streaking.  The pain felt like it was going into her hand and arm.  ALLERGIES:  No known drug allergies.  PAST MEDICAL HISTORY:  None.  PAST SURGICAL HISTORY:  Tubal ligation.  MEDICATIONS:  None.  FAMILY HISTORY:  Negative.  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  She does not smoke or use alcohol.  REVIEW OF SYSTEMS:  Thirteen point review of systems are negative.  PHYSICAL EXAMINATION:  GENERAL:  Alert and oriented x3, obese, normal gait. EXTREMITIES:  Bilateral upper extremities are intact to light  touch, sensation, capillary refill in all fingertips.  She can flex and extend the IP joint of the thumbs and cross her fingers.  In the left upper extremity, she has no wounds, no tenderness to palpation.  Right upper extremity with the exception of the small finger she is not tender to palpation.  She has mild tenderness over the small finger metacarpal. She is exquisitely tender at the dorsum of the PIP joint.  She has mild tenderness volarly but is able to flex the finger.  We will place an extension.  She has some pain in the finger, but mostly dorsally.  There is no fusiform swelling.  There is a small amount of fluctuance notable at the dorsum of the PIP joint.  There is mild redness and a whitish spot on the dorsum of the skin.  There is no streaking.  RADIOGRAPHS:  Review of her MRI shows a fluid collection dorsal to the condyles of the proximal phalanx.  There is destruction of the extensor tendon.  There is subluxation of the PIP  joint volarly.  ASSESSMENT/PLAN:  Right small finger infection.  I discussed with Ms. Wilds and her family member who is with her the nature of her condition.  We discussed continued nonoperative treatment of this with antibiotics and close followup versus going to the operating room for irrigation and debridement of the finger.  She wishes to have irrigation and debridement performed, I think that is reasonable.  Hospital interpreter was available to further discuss the history and findings as well as risks, benefits, and alternatives of surgery including the risk of blood loss, infection, damage to nerves, vessels, tendons, ligaments, bone, failure of procedure, need for additional procedures, complications with wound healing, continued pain, continued infection, need for repeat irrigation and debridement.  We also discussed that the extensor tendon is very likely damaged and will not be repaired at this time and will probably require a  reconstruction in the future.  She understands and agrees to the plan of care.  Consent was obtained by the hospital interpreter.  We will have the surgery arranged.     Betha Loa, MD     KK/MEDQ  D:  05/26/2011  T:  05/27/2011  Job:  [id number]  Electronically Signed by Betha Loa  on 06/02/2011 03:54:36 PM

## 2011-06-02 NOTE — Op Note (Signed)
NAMEMERRYL, [name]             ACCOUNT NO.:  [id number]  MEDICAL RECORD NO.:  [id number]  LOCATION:  5033                         FACILITY:  [facility]  PHYSICIAN:  Betha Loa, MD        DATE OF BIRTH:  September 02, 1971  DATE OF PROCEDURE:  05/26/2011 DATE OF DISCHARGE:                              OPERATIVE REPORT   PREOPERATIVE DIAGNOSIS:  Right small finger infection.  POSTOPERATIVE DIAGNOSIS:  Right small finger infection versus toxic reaction.  PROCEDURE:  Incision and drainage of right small finger PIP joint.  SURGEON:  Betha Loa, MD  ASSISTANTS:  None.  ANESTHESIA:  General.  IV FLUIDS:  Per Anesthesia flow sheet.  ESTIMATED BLOOD LOSS:  Minimal.  COMPLICATIONS:  None.  SPECIMENS:  Cultures to micro.  TOURNIQUET TIME:  18 minutes.  DISPOSITION:  Stable to PACU.  INDICATIONS:  [name] is a 40 year old Hispanic female who states she was bitten on the right small finger by a shiny black spider with a red done on its belly.  This occurred on May 18, 2011.  She had numbness in her mouth and shortness of breath initially.  She then noticed swelling in the finger.  She was seen by primary care.  She was sent to the emergency department 2 days ago after an MRI showed possible infection in the finger by way of fluid collection and erosion of the tendon.  She was admitted overnight for IV antibiotics and did well and was discharged home.  She followed up in the office this morning and was continuing to have pain and increased deformity in the small finger. She has small amount of swelling.  There was minimal erythema, but significant pain directly over the dorsum of the PIP joint.  There was some swelling and small amount fluctuance.  Review of the MRI showed potential pocket of fluid dorsal to the distal condyles of the proximal phalanx.  Discussed with [name] the nature of this condition.  We discussed continuing oral antibiotics and observation versus  operative irrigation and debridement.  She wished to have an operating irrigation and debridement.  I think that was reasonable.  Risks, benefits, and alternatives of surgery were discussed including risk of blood loss, infection, damage to nerves, vessels, tendons, ligaments bone, failure to procedure, need for additional procedures, complications with wound healing, continued pain, continued infection, need for repeat irrigation and debridement.  She voiced understanding of these risks and elected to proceed.  OPERATIVE COURSE:  After being identified preoperatively by myself, the patient & I agreed upon procedure and site of procedure.  The surgical site was marked.  Risks, benefits and alternatives of surgery were reviewed and she wished to proceed.  A hospital interpreter was used.  She was transferred to the operating and placed on the operating table in supine position with the right upper extremity on arm board.  General anesthesia was induced by the anesthesiologist.  The right upper extremity prepped and draped in normal sterile orthopedic fashion.  A surgical pause was performed between surgeons, Anesthesia, operating room staff, and all were in agreement with the patient procedure and site procedure.  Tourniquet to proximal aspect of the extremity was inflated  to 150 mmHg after exsanguination of limb by gravity.  A curvilinear incision was made.  The dorsal ulnar side of the PIP joint of the small finger.  This was carried into subcutaneous tissues.  No gross purulence was found within the subcutaneous tissues.  The extensor tendon felt boggy and there was fluid underneath it.  A rent was made on the ulnar side between the central tendon in the lateral bands.  There was fluid deep to the tendon, but no gross purulence.  This was cultured and sent to Microbiology Lab.  The tendon was very thin and patulous in this area.  The wound in the PIP joint were copiously irrigated  with 1000 mL of sterile saline by bulb syringe and Angiocath.  The wound was then packed with Iodoform gauze.  Two 5-0 sutures were placed proximal and distal to help keep the wound from gapping open, though the wound was still open.  It was then injected with 4 mL of 0.25% plain Marcaine to aid in postoperative analgesia.  It was dressed with sterile Xeroform and 4 x 4's and wrapped with Kerlix.  A volar dorsal slab splint was placed with the MPs flexed the obtuse extended.  The long ring and ring small fingers were occluded.  This was wrapped with Kerlix and Ace bandage.  Tourniquet was deflated at 18 minutes.  Fingertips were pink with brisk capillary refill after deflation of the tourniquet.  The operative drapes were broken and the patient was awoken from anesthesia safely.  She was transferred back to stretcher and taken to PACU in stable condition.  We will keep her overnight for IV antibiotics observation.  We will ask the hospitalist to see her due to the potential systemic complications of black widow spider bites.  We will also consider having Infectious Disease evaluate her tomorrow.     Betha Loa, MD     KK/MEDQ  D:  05/26/2011  T:  05/27/2011  Job:  [id number]  Electronically Signed by Betha Loa  on 06/02/2011 03:55:57 PM

## 2011-06-06 NOTE — Consult Note (Signed)
  [name], [name]             ACCOUNT NO.:  [id number]  MEDICAL RECORD NO.:  [id number]  LOCATION:  [facility]                         FACILITY:  [facility]  PHYSICIAN:  Artist Pais. Aileana Hodder, M.D.DATE OF BIRTH:  [birth year]  DATE OF CONSULTATION:  05/24/2011 DATE OF DISCHARGE:                                CONSULTATION   PHYSICIAN REQUESTING CONSULTATION:  Gwyneth Sprout, MD.  REASON FOR CONSULTATION:  Ms. Dickson is a 40 year old right-hand- dominant female who says through her son, interpreter that on May 18, 2011, she was bitten by, what appeared to be, by description, a black widow spider over the small finger dorsally, dominant right hand.  He has had persistent pain and swelling and now presents with an acutely flexed PIP joint.  She was seen [facility], MRI was obtained, and presents today in mild distress.  She says she was seen there on several occasions, was given IM antibiotics, given Percocet and Voltaren with no significant changes in her symptoms and, in fact, worsening.  She is otherwise a healthy 40 year old female with no known drug allergies.  No current medications.  No recent hospitalizations or surgery.  FAMILY MEDICAL HISTORY:  Noncontributory.  SOCIAL HISTORY:  Noncontributory.  PHYSICAL EXAMINATION:  GENERAL:  A well-nourished female in mild distress. EXTREMITIES:  She has an acutely flexed posture of the PIP joint.  I slightly extended the posture of the distal interphalangeal joint consistent with acute Boutonniere deformity.  She has no pain of flexor sheath.  She has mild swelling over the PIP joint, but no fluctuance, no signs of drainage, no streak up the arm, no signs of an ascending infection.  She says by history, she has been afebrile.  Her white count in the emergency room today is 9000.  Available for view are notes from [facility] and an MRI that was performed at [facility] that shows what appears to be an acute disruption of  the extensor mechanism of the PIP joint consistent with an acute Boutonniere-type deformity with a central slip rupture and swelling throughout the finger.  IMPRESSION:  A 40 year old female with a possible necrotic central slip rupture secondary to a spider bite, now with pain and swelling and an acute Boutonniere deformity.  At this point in time, we discussed with the emergency room staff.  I have recommended to cover her with a 23- hour course of antibiotics intravenously just in case this is infection, although there is little likelihood of this.  She is to be admitted to the CVU, receive IV Unasyn 3 g loading dose, follow 1.5 g q.6 h.  She is being discharged with a PIP extension splint and she will follow up in my office this Friday, see Dr. Betha Loa in my absence or if she is doing better, I will see her next Tuesday, May 30, 2011, in my office.     Artist Pais Mina Marble, M.D.     MAW/MEDQ  D:  05/24/2011  T:  05/25/2011  Job:  [id number]  Electronically Signed by Dairl Ponder M.D. on 06/06/2011 04:54:09 PM

## 2012-07-02 ENCOUNTER — Emergency Department (HOSPITAL_COMMUNITY): Payer: Medicaid Other

## 2012-07-02 ENCOUNTER — Encounter (HOSPITAL_COMMUNITY): Payer: Self-pay | Admitting: *Deleted

## 2012-07-02 ENCOUNTER — Emergency Department (HOSPITAL_COMMUNITY)
Admission: EM | Admit: 2012-07-02 | Discharge: 2012-07-02 | Disposition: A | Discharge status: Home / Self Care | Payer: Medicaid Other | Attending: Emergency Medicine | Admitting: Emergency Medicine

## 2012-07-02 DIAGNOSIS — N63 Unspecified lump in unspecified breast: Secondary | ICD-10-CM | POA: Insufficient documentation

## 2012-07-02 DIAGNOSIS — IMO0001 Reserved for inherently not codable concepts without codable children: Secondary | ICD-10-CM | POA: Insufficient documentation

## 2012-07-02 DIAGNOSIS — R079 Chest pain, unspecified: Secondary | ICD-10-CM | POA: Insufficient documentation

## 2012-07-02 DIAGNOSIS — M791 Myalgia, unspecified site: Secondary | ICD-10-CM

## 2012-07-02 DIAGNOSIS — R1011 Right upper quadrant pain: Secondary | ICD-10-CM | POA: Insufficient documentation

## 2012-07-02 LAB — URINALYSIS, ROUTINE W REFLEX MICROSCOPIC
Glucose, UA: NEGATIVE mg/dL
Leukocytes, UA: NEGATIVE
Nitrite: NEGATIVE
Specific Gravity, Urine: 1.016 (ref 1.005–1.030)
pH: 7.5 (ref 5.0–8.0)

## 2012-07-02 LAB — COMPREHENSIVE METABOLIC PANEL
ALT: 20 U/L (ref 0–35)
Albumin: 3.6 g/dL (ref 3.5–5.2)
Alkaline Phosphatase: 75 U/L (ref 39–117)
BUN: 10 mg/dL (ref 6–23)
Chloride: 100 mEq/L (ref 96–112)
Glucose, Bld: 90 mg/dL (ref 70–99)
Potassium: 4.1 mEq/L (ref 3.5–5.1)
Total Bilirubin: 0.2 mg/dL — ABNORMAL LOW (ref 0.3–1.2)

## 2012-07-02 LAB — CBC WITH DIFFERENTIAL/PLATELET
Eosinophils Relative: 1 % (ref 0–5)
HCT: 37.8 % (ref 36.0–46.0)
Lymphs Abs: 3.8 10*3/uL (ref 0.7–4.0)
MCV: 88.9 fL (ref 78.0–100.0)
Monocytes Relative: 8 % (ref 3–12)
Neutro Abs: 4.8 10*3/uL (ref 1.7–7.7)
RBC: 4.25 MIL/uL (ref 3.87–5.11)
WBC: 9.6 10*3/uL (ref 4.0–10.5)

## 2012-07-02 MED ORDER — ONDANSETRON HCL 4 MG/2ML IJ SOLN
4.0000 mg | Freq: Once | INTRAMUSCULAR | Status: AC
  Administered 2012-07-02: 4 mg via INTRAVENOUS
  Filled 2012-07-02: qty 2

## 2012-07-02 MED ORDER — MORPHINE SULFATE 4 MG/ML IJ SOLN
4.0000 mg | Freq: Once | INTRAMUSCULAR | Status: AC
  Administered 2012-07-02: 4 mg via INTRAVENOUS
  Filled 2012-07-02: qty 1

## 2012-07-02 MED ORDER — SODIUM CHLORIDE 0.9 % IV BOLUS (SEPSIS)
500.0000 mL | Freq: Once | INTRAVENOUS | Status: AC
  Administered 2012-07-02: 500 mL via INTRAVENOUS

## 2012-07-02 MED ORDER — SODIUM CHLORIDE 0.9 % IV SOLN
INTRAVENOUS | Status: DC
  Administered 2012-07-02: 18:00:00 via INTRAVENOUS

## 2012-07-02 NOTE — ED Notes (Signed)
Ultrasound being performed.

## 2012-07-02 NOTE — ED Provider Notes (Signed)
History     CSN: [id number]  Arrival date & time 07/02/12  1605   First MD Initiated Contact with Patient 07/02/12 1625      Chief Complaint  Patient presents with  . Chest Pain    (Consider location/radiation/quality/duration/timing/severity/associated sxs/prior treatment) HPI Comments: [name] is a 41 y.o. Female who has had chest pain. Right upper quadrant pain, myalgias, achiness, and nausea and vomiting for several days. She also has a knot in her left breast. She tried Advil without relief. She takes no regular medications. She has no medical doctor. She works as a Advertising copywriter. Drinking and eating causes nausea. There are no palliative factors.  Patient is a 41 y.o. female presenting with chest pain. The history is provided by the patient.  Chest Pain     History reviewed. No pertinent past medical history.  History reviewed. No pertinent past surgical history.  No family history on file.  History  Substance Use Topics  . Smoking status: Current Everyday Smoker  . Smokeless tobacco: Not on file  . Alcohol Use: No    OB History    Grav Para Term Preterm Abortions TAB SAB Ect Mult Living                  Review of Systems  Cardiovascular: Positive for chest pain.  All other systems reviewed and are negative.    Allergies  Review of patient's allergies indicates no known allergies.  Home Medications   Current Outpatient Rx  Name Route Sig Dispense Refill  . IBUPROFEN 200 MG PO TABS Oral Take 400 mg by mouth every 8 (eight) hours as needed. For pain.      BP 120/83  Pulse 88  Temp 97.5 F (36.4 C) (Oral)  Resp 20  SpO2 99%  LMP 05/30/2012  Physical Exam  Nursing note and vitals reviewed. Constitutional: She is oriented to person, place, and time. She appears well-developed and well-nourished.       She is obese  HENT:  Head: Normocephalic and atraumatic.  Eyes: Conjunctivae and EOM are normal. Pupils are equal, round, and reactive to  light.  Neck: Normal range of motion and phonation normal. Neck supple.  Cardiovascular: Normal rate, regular rhythm and intact distal pulses.   Pulmonary/Chest: Effort normal and breath sounds normal. She exhibits no tenderness.       2 x 4 cm left breast mass, tender; adjacent to nipple, at 9:00 position. No left nipple d/c. No skin color change or deformity.  Abdominal: Soft. She exhibits no distension and no mass. There is tenderness (Mild right upper quadrant tenderness). There is no rebound and no guarding.  Musculoskeletal: Normal range of motion. She exhibits no edema and no tenderness.  Neurological: She is alert and oriented to person, place, and time. She has normal strength. She exhibits normal muscle tone.  Skin: Skin is warm and dry.  Psychiatric: She has a normal mood and affect. Her behavior is normal. Judgment and thought content normal.    ED Course  Procedures (including critical care time)  And reevaluation for discharge. The patient states that she has previously had mammograms the last 18 months ago, and has had a breast mass previously.   Date: 07/02/2012  Rate:80  Rhythm: normal sinus rhythm  QRS Axis: normal  Intervals: normal  ST/T Wave abnormalities: normal  Conduction Disutrbances:none  Narrative Interpretation:   Old EKG Reviewed: none available    Labs Reviewed  COMPREHENSIVE METABOLIC PANEL - Abnormal; Notable for  the following:    Total Bilirubin 0.2 (*)     All other components within normal limits  URINALYSIS, ROUTINE W REFLEX MICROSCOPIC - Abnormal; Notable for the following:    APPearance TURBID (*)     All other components within normal limits  CBC WITH DIFFERENTIAL  LIPASE, BLOOD  URINE MICROSCOPIC-ADD ON  URINE CULTURE   Dg Chest 2 View  07/02/2012  *RADIOLOGY REPORT*  Clinical Data: Chest pain  CHEST - 2 VIEW  Comparison: None.  Findings: Heart size is within normal limits.  Normal vascularity. Lungs are clear without infiltrate or  effusion.  Negative for mass lesion.  IMPRESSION: No active cardiopulmonary disease.   Original Report Authenticated By: Camelia Phenes, M.D.    US Abdomen Complete  07/02/2012  *RADIOLOGY REPORT*  Clinical Data:  Right upper quadrant pain  COMPLETE ABDOMINAL ULTRASOUND  Comparison:  None.  Findings:  Gallbladder:  No gallstones, gallbladder wall thickening, or pericholecystic fluid.  Common bile duct:  5.0 mm  Liver:  Echogenic liver which is difficult to evaluate by ultrasound.  No mass lesion is identified.  IVC:  Appears normal.  Pancreas:  Limited  Spleen:  8.9 cm  Right Kidney:  12.1 cm.  Normal  Left Kidney:  13.5 cm.  Normal  Abdominal aorta:  Limited  IMPRESSION: Negative for gallstones.  Fatty infiltration of the liver.   Original Report Authenticated By: Camelia Phenes, M.D.      1. Breast mass   2. Myalgia       MDM  Nonspecific myalgias, with negative ED evaluation. Breast mass is recurrent and can be evaluated as an out patient. Patient referred to the breast center for diagnostic evaluation of the breast mass            Flint Melter, MD 07/03/12 [phone]

## 2012-07-02 NOTE — ED Notes (Addendum)
Pt reports left sided chest pain x 3 days- reports it as pressure. Pt spanish speaking. Reports back pain, vomited x 1 yesterday, shortness of breath. No cardiac hx. Pain worse with palpation and deep breath.

## 2012-07-03 ENCOUNTER — Other Ambulatory Visit: Payer: Self-pay | Admitting: Emergency Medicine

## 2012-07-03 DIAGNOSIS — N63 Unspecified lump in unspecified breast: Secondary | ICD-10-CM

## 2012-07-03 LAB — URINE CULTURE: Colony Count: 30000

## 2012-07-09 ENCOUNTER — Other Ambulatory Visit: Payer: Self-pay

## 2012-11-24 IMAGING — CR DG KNEE COMPLETE 4+V*L*
3 series · 3 of 3 positions shown · non-contrast
Comparison: None.

CLINICAL DATA: Fall.  Knee pain.

LEFT KNEE - COMPLETE 4+ VIEW

[AP]
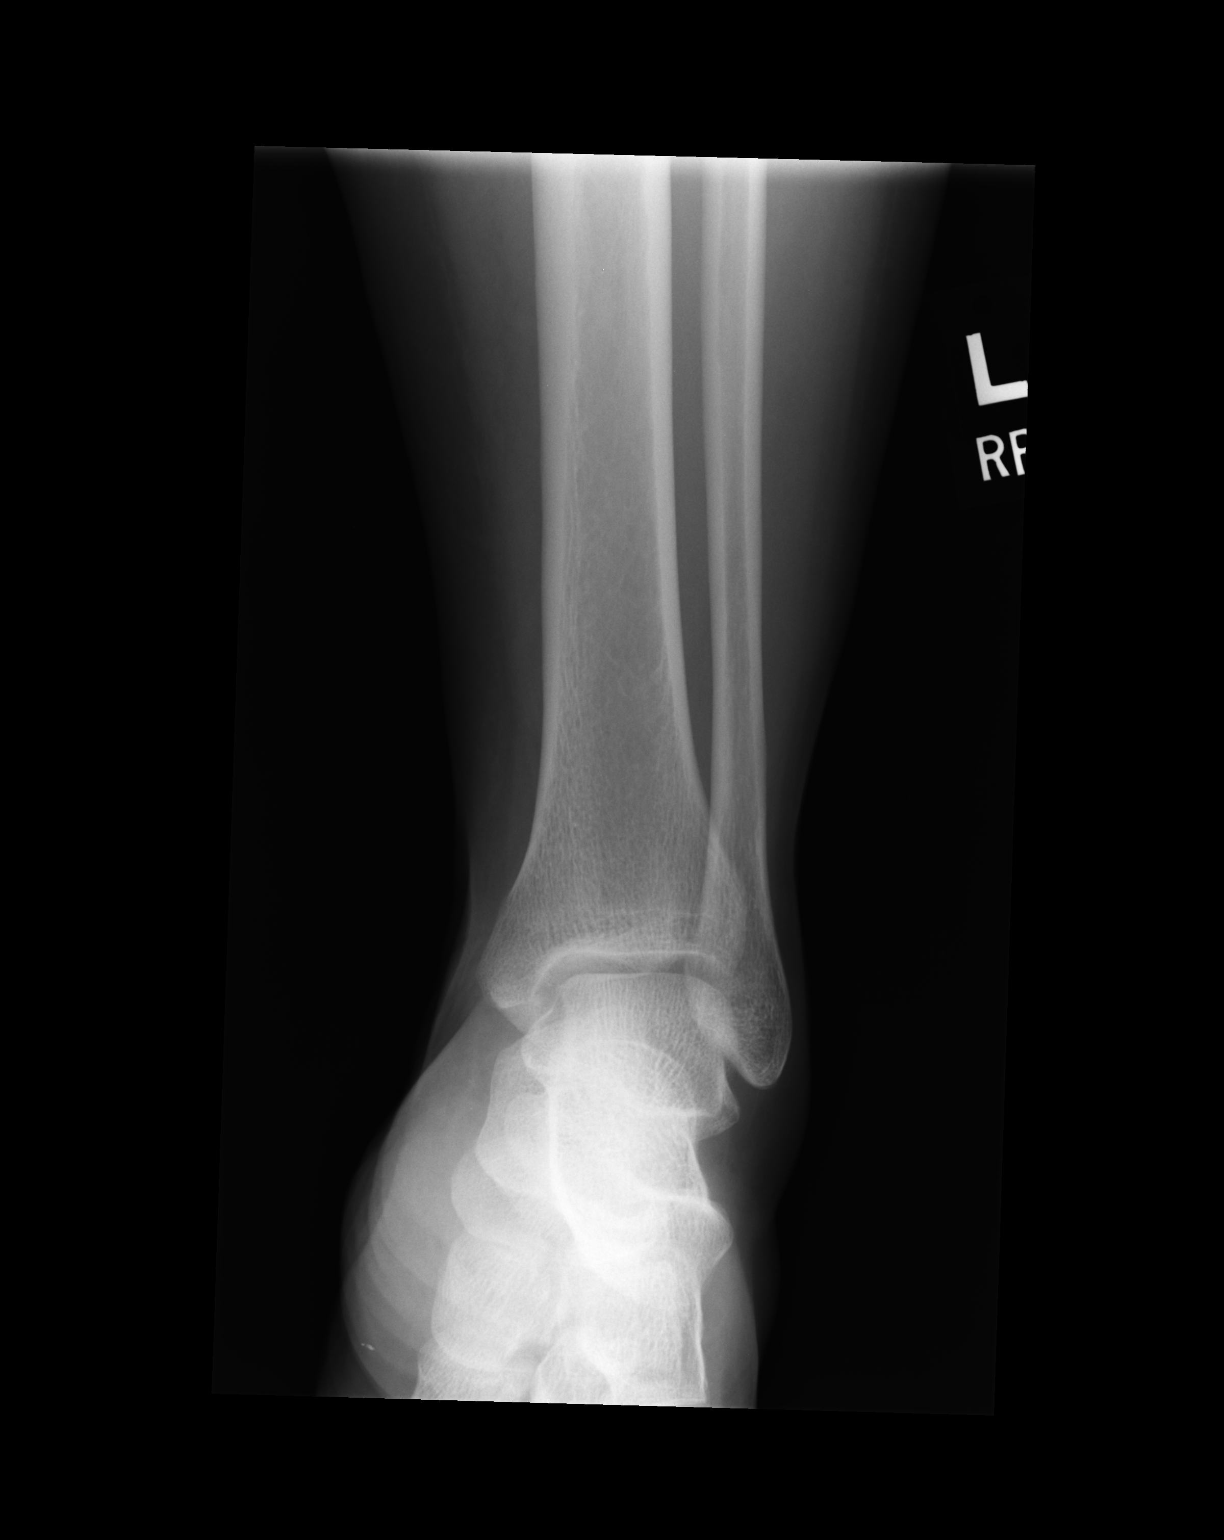

[ap obl int rot]
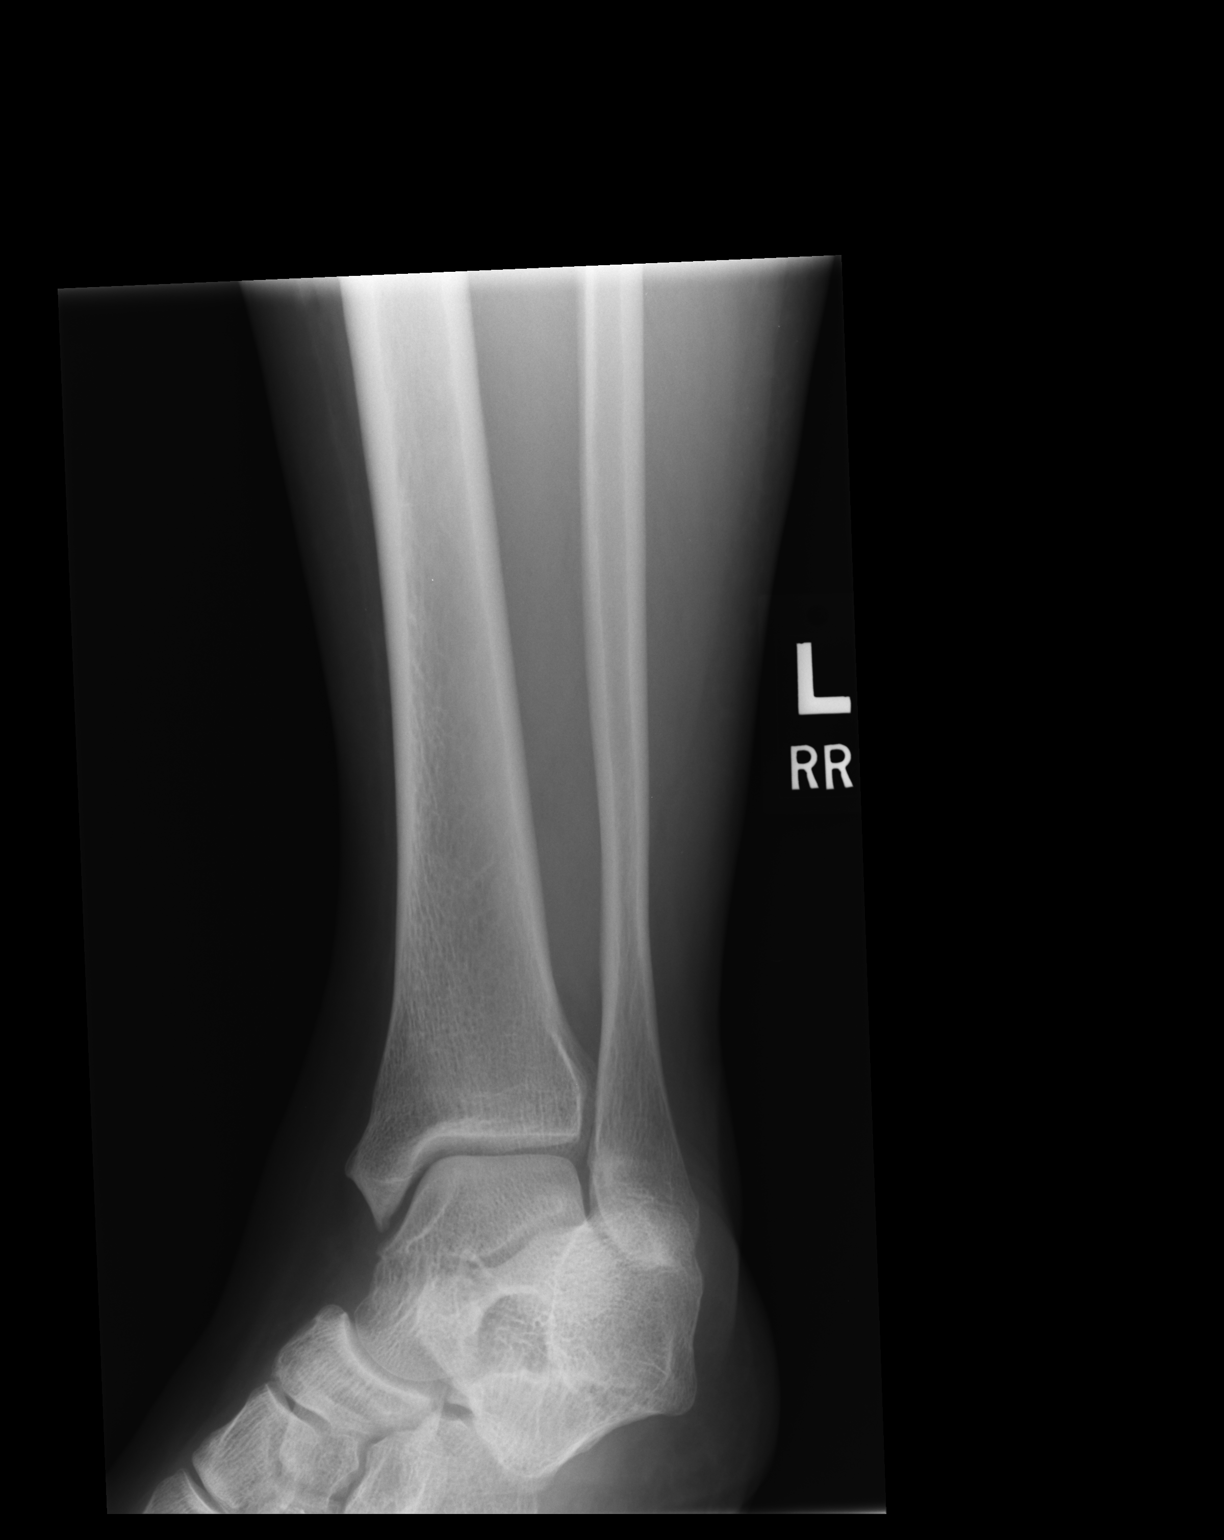

[lateral]
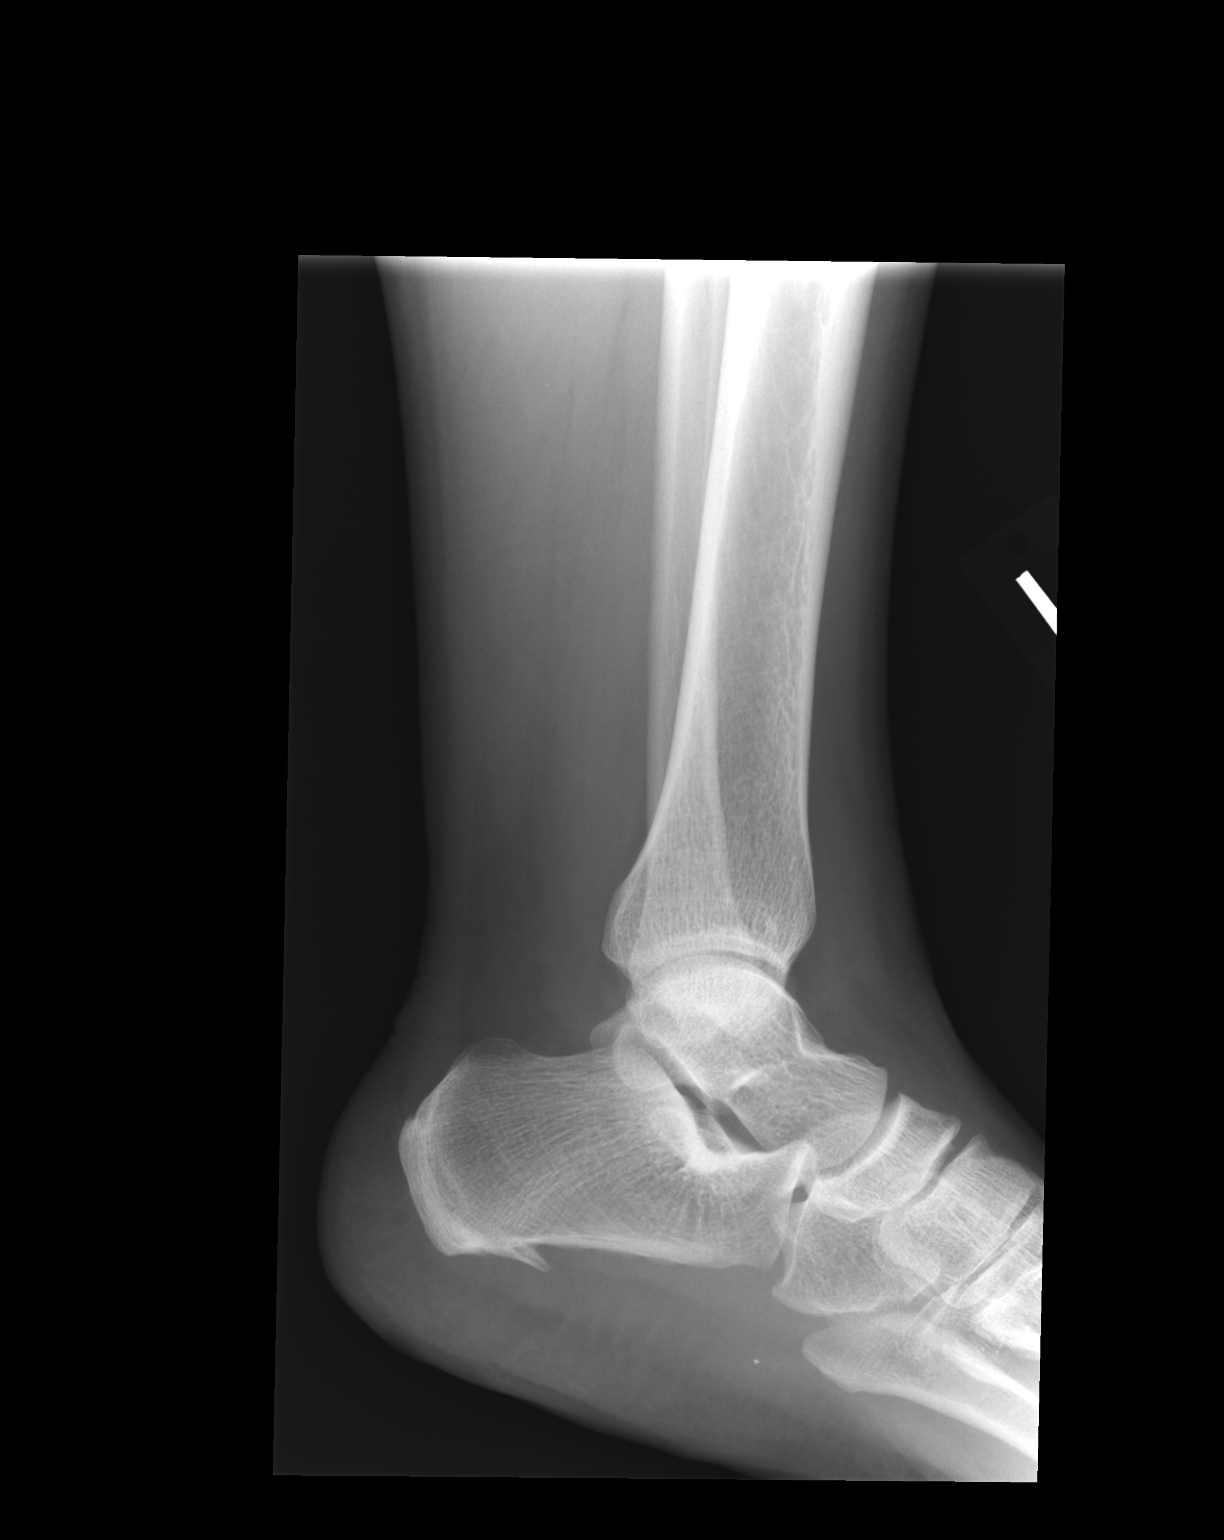

[3 of 3 positions shown; findings below may reference images not displayed]

FINDINGS: No fracture or knee effusion noted.  No acute bony
findings.
IMPRESSION: 1.  No acute bony findings.

## 2012-11-25 ENCOUNTER — Other Ambulatory Visit (HOSPITAL_COMMUNITY): Payer: Self-pay | Admitting: Specialist

## 2012-11-25 DIAGNOSIS — M79605 Pain in left leg: Secondary | ICD-10-CM

## 2012-11-25 DIAGNOSIS — W19XXXA Unspecified fall, initial encounter: Secondary | ICD-10-CM

## 2012-12-03 ENCOUNTER — Encounter (HOSPITAL_COMMUNITY): Payer: Self-pay

## 2013-03-20 ENCOUNTER — Other Ambulatory Visit: Payer: Self-pay | Admitting: Specialist

## 2013-03-20 DIAGNOSIS — M545 Low back pain: Secondary | ICD-10-CM

## 2013-03-26 ENCOUNTER — Other Ambulatory Visit: Payer: Self-pay

## 2013-03-27 ENCOUNTER — Other Ambulatory Visit: Payer: Self-pay

## 2013-03-31 ENCOUNTER — Encounter (HOSPITAL_COMMUNITY): Payer: Self-pay

## 2013-03-31 ENCOUNTER — Emergency Department (HOSPITAL_COMMUNITY)
Admission: EM | Admit: 2013-03-31 | Discharge: 2013-04-01 | Disposition: A | Discharge status: Home / Self Care | Payer: Worker's Compensation | Attending: Emergency Medicine | Admitting: Emergency Medicine

## 2013-03-31 ENCOUNTER — Emergency Department (HOSPITAL_COMMUNITY): Payer: Worker's Compensation

## 2013-03-31 DIAGNOSIS — M519 Unspecified thoracic, thoracolumbar and lumbosacral intervertebral disc disorder: Secondary | ICD-10-CM | POA: Insufficient documentation

## 2013-03-31 DIAGNOSIS — Z87828 Personal history of other (healed) physical injury and trauma: Secondary | ICD-10-CM | POA: Insufficient documentation

## 2013-03-31 DIAGNOSIS — Z8739 Personal history of other diseases of the musculoskeletal system and connective tissue: Secondary | ICD-10-CM | POA: Insufficient documentation

## 2013-03-31 DIAGNOSIS — Z791 Long term (current) use of non-steroidal anti-inflammatories (NSAID): Secondary | ICD-10-CM | POA: Insufficient documentation

## 2013-03-31 HISTORY — DX: Dorsalgia, unspecified: M54.9

## 2013-03-31 MED ORDER — HYDROMORPHONE HCL PF 1 MG/ML IJ SOLN
1.0000 mg | Freq: Once | INTRAMUSCULAR | Status: AC
  Administered 2013-04-01: 1 mg via INTRAVENOUS
  Filled 2013-03-31: qty 1

## 2013-03-31 MED ORDER — DIPHENHYDRAMINE HCL 50 MG/ML IJ SOLN
25.0000 mg | Freq: Once | INTRAMUSCULAR | Status: AC
  Administered 2013-03-31: 25 mg via INTRAVENOUS
  Filled 2013-03-31: qty 1

## 2013-03-31 MED ORDER — LORAZEPAM 2 MG/ML IJ SOLN
1.0000 mg | Freq: Once | INTRAMUSCULAR | Status: AC
  Administered 2013-03-31: 1 mg via INTRAVENOUS
  Filled 2013-03-31: qty 1

## 2013-03-31 MED ORDER — DEXAMETHASONE SODIUM PHOSPHATE 10 MG/ML IJ SOLN
10.0000 mg | Freq: Once | INTRAMUSCULAR | Status: AC
  Administered 2013-04-01: 10 mg via INTRAVENOUS
  Filled 2013-03-31: qty 1

## 2013-03-31 MED ORDER — HYDROMORPHONE HCL PF 1 MG/ML IJ SOLN
1.0000 mg | Freq: Once | INTRAMUSCULAR | Status: AC
  Administered 2013-03-31: 1 mg via INTRAVENOUS
  Filled 2013-03-31: qty 1

## 2013-03-31 MED ORDER — SODIUM CHLORIDE 0.9 % IV SOLN
Freq: Once | INTRAVENOUS | Status: AC
  Administered 2013-03-31: 20:00:00 via INTRAVENOUS

## 2013-03-31 MED ORDER — ONDANSETRON HCL 4 MG/2ML IJ SOLN
4.0000 mg | Freq: Once | INTRAMUSCULAR | Status: AC
  Administered 2013-03-31: 4 mg via INTRAVENOUS
  Filled 2013-03-31: qty 2

## 2013-03-31 MED ORDER — HYDROMORPHONE HCL PF 1 MG/ML IJ SOLN: 1.0000 mg | Freq: Once | INTRAMUSCULAR | Status: DC

## 2013-03-31 NOTE — ED Notes (Signed)
Pt reports that she is "claustrophobic"--- Charlestine Night, PA was made aware; verbal order received to give ativan 1 mg IV prior to MRI.

## 2013-03-31 NOTE — ED Notes (Signed)
Patient reports that she fell on her job 08/17/12. Patient had left knee injury and low back pain at this time. Patient had a myelogram 3 days ago and today the back pain is worse and is unable to lift left leg entirely. Patient states the  Left leg feels numb and heavy. Patient also c/o blurred vision and feeling thirsty.

## 2013-04-01 MED ORDER — PREDNISONE 50 MG PO TABS: 50.0000 mg | ORAL_TABLET | Freq: Every day | ORAL | Status: DC

## 2013-04-01 MED ORDER — DIAZEPAM 5 MG PO TABS: 5.0000 mg | ORAL_TABLET | Freq: Three times a day (TID) | ORAL | Status: DC | PRN

## 2013-04-01 MED ORDER — PERCOCET 5-325 MG PO TABS: 1.0000 | ORAL_TABLET | Freq: Four times a day (QID) | ORAL | Status: DC | PRN

## 2013-04-01 NOTE — ED Provider Notes (Signed)
History     CSN: [id number]  Arrival date & time 03/31/13  1743   First MD Initiated Contact with Patient 03/31/13 1851      Chief Complaint  Patient presents with  . Back Pain    (Consider location/radiation/quality/duration/timing/severity/associated sxs/prior treatment) HPI Patient presents emergency department with increased lower back pain.  Patient, states, that she's had chronic problems since a work-related injury.  Patient, states she sees Dr. Otelia Sergeant.  Patient had a myelogram done 3 days, ago.  Since that time the pain is increased patient, states, that it hurts to walk and move.  Patient denies incontinence, nausea, vomiting, abdominal pain, headache, blurred vision, chest pain, shortness of breath, numbness, or fever.  Patient, states, that when she raises her legs it causes increased pain in her back. Past Medical History  Diagnosis Date  . Back pain     Past Surgical History  Procedure Laterality Date  . Finger surgery    . Abdominal hysterectomy      No family history on file.  History  Substance Use Topics  . Smoking status: Never Smoker   . Smokeless tobacco: Never Used  . Alcohol Use: No    OB History   Grav Para Term Preterm Abortions TAB SAB Ect Mult Living                  Review of Systems All other systems negative except as documented in the HPI. All pertinent positives and negatives as reviewed in the HPI. Allergies  Review of patient's allergies indicates no known allergies.  Home Medications   Current Outpatient Rx  Name  Route  Sig  Dispense  Refill  . meloxicam (MOBIC) 15 MG tablet   Oral   Take 15 mg by mouth daily.         . traMADol (ULTRAM) 50 MG tablet   Oral   Take 50 mg by mouth every 6 (six) hours as needed for pain.           BP 157/75  Pulse 87  Temp(Src) 98 F (36.7 C) (Oral)  Resp 16  Ht 5\' 1"  (1.549 m)  Wt 260 lb (117.935 kg)  BMI 49.15 kg/m2  SpO2 96%  LMP 05/30/2012  Physical Exam  Nursing note and  vitals reviewed. Constitutional: She is oriented to person, place, and time. She appears well-developed and well-nourished. No distress.  HENT:  Head: Normocephalic and atraumatic.  Eyes: Pupils are equal, round, and reactive to light.  Neck: Normal range of motion. Neck supple.  Cardiovascular: Normal rate, regular rhythm and normal heart sounds.   Pulmonary/Chest: Effort normal and breath sounds normal.  Musculoskeletal:       Lumbar back: She exhibits tenderness, pain and spasm. She exhibits no bony tenderness.       Back:  Neurological: She is alert and oriented to person, place, and time. She has normal strength and normal reflexes. No sensory deficit. She exhibits normal muscle tone. Coordination and gait normal. GCS eye subscore is 4. GCS verbal subscore is 5. GCS motor subscore is 6.  Skin: Skin is warm and dry.    ED Course  Procedures (including critical care time)  Labs Reviewed  GLUCOSE, CAPILLARY - Abnormal; Notable for the following:    Glucose-Capillary 117 (*)    All other components within normal limits   Mr Lumbar Spine Wo Contrast  03/31/2013   *RADIOLOGY REPORT*  Clinical Data: Back and bilateral extremity weakness.  MRI LUMBAR SPINE WITHOUT CONTRAST  Technique:  Multiplanar and multiecho pulse sequences of the lumbar spine were obtained without intravenous contrast.  Comparison: 05/26/2010.  Findings: Examination is limited due to body habitus and motion artifact.  The axial images in particular are limited.  Normal alignment of the lumbar vertebral bodies.  They demonstrate normal marrow signal.  The last full intervertebral disc space is labeled L5-S1 and the conus medullaris terminates at L1.  The facets are normally aligned.  Mild facet disease but no pars defects.  No significant paraspinal or retroperitoneal process is identified. A small uterine fibroid is noted.  Mild distention of the bladder is noted.  L1-2:  No significant findings.  L2-3:  No significant  findings.  L3-4:  No significant findings.  L4-5:  No significant findings.  L5-S1:  Shallow left paracentral and left foraminal disc protrusion possibly irritating the left L5 nerve root.  Minimal right foraminal encroachment also.  Mild to moderate facet disease.  IMPRESSION:  1.  Limited examination due to body habitus and motion artifact. 2.  Shallow broad-based left paracentral and foraminal disc protrusion at L5-S1 with possible irritation of the left L5 nerve root.   Original Report Authenticated By: Rudie Meyer, M.D.     Patient does not have any neurological deficits noted on exam.  She has good motor strength in her lower extremities with normal reflexes.  There is an explanation for her radicular symptoms and pain with range of motion of the leg.  I advised the patient should followup with Dr.Nitka.  Patient is advised to return here for any worsening in her condition   MDM  MDM Reviewed: vitals, nursing note and previous chart Interpretation: labs and MRI            Carlyle Dolly, PA-C 04/01/13 [phone]

## 2013-04-07 NOTE — ED Provider Notes (Signed)
Medical screening examination/treatment/procedure(s) were performed by non-physician practitioner and as supervising physician I was immediately available for consultation/collaboration.   Hurman Horn, MD 04/07/13 1710

## 2013-04-11 ENCOUNTER — Encounter (HOSPITAL_COMMUNITY): Payer: Self-pay | Admitting: Pharmacy Technician

## 2013-04-11 ENCOUNTER — Other Ambulatory Visit (HOSPITAL_COMMUNITY): Payer: Self-pay | Admitting: Specialist

## 2013-04-21 NOTE — H&P (Signed)
[name] is an 42 y.o. female.   Chief Complaint: back pain and left leg pain HPI: Pt with pain in her back and left leg since an on the job injury 08/17/2012.  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  She was evaluated for pain in the left knee and foot as well as the back.  Initially EMG/NCV and MRI scan was without abnormal findings.  She was treated with medication and physical therapy.  When she tried to return to her regular work duties her back pain increased with severe leg pain inhibiting her from walking or standing for any lengthy amount of time.  She became so painful that she presented to the emergency department for evaluation in May and an MRI was done.  The study showed a small disk protrusion paracentral to the left side at the L5-S1 level, this seems to enter the left L5 neural foramen.This does enter the neural foramen and may cause some nerve irritation particularly with extension and standing.  As it is a disk protrusion it would be worse with bending and stooping and potentially also irritative with the standing position as it does enter the foramen on this left side.  She has completed attempts at conservative management over the last 6 months. As she is unable to return to her usual functions and her work it is recommended that she undergo surgical intervention and she wished to proceed.  Past Medical History  Diagnosis Date  . Back pain     Past Surgical History  Procedure Laterality Date  . Finger surgery    . Abdominal hysterectomy      No family history on file. Social History:  reports that she has never smoked. She has never used smokeless tobacco. She reports that she does not drink alcohol or use illicit drugs.  Allergies: No Known Allergies  No prescriptions prior to admission    No results found for this or any previous visit (from the past 48 hour(s)). No results found.  Review of  Systems  Constitutional: Negative.   HENT: Positive for neck pain.   Eyes: Negative.   Respiratory: Negative.   Cardiovascular: Negative.   Gastrointestinal: Negative.   Genitourinary: Negative.   Skin: Negative.   Neurological: Positive for tingling and focal weakness.  Endo/Heme/Allergies: Negative.   Psychiatric/Behavioral: Negative.     Last menstrual period 05/30/2012. Physical Exam  Constitutional: She is oriented to person, place, and time. She appears well-developed and well-nourished.  HENT:  Head: Normocephalic and atraumatic.  Eyes: EOM are normal. Pupils are equal, round, and reactive to light.  Neck: Normal range of motion.  Cardiovascular: Normal rate.   Respiratory: Effort normal.  GI: Soft.  Musculoskeletal:  PHYSICAL EXAMINATION:  First examining her sciatic stretch sign is mildly uncomfortable on the left, negative on the right.  Reflexes at the knee 1+ and symmetric, at the ankle 1+ and symmetric.  She has weakness in dorsiflexion of the left great toe.  Describes numbness and paresthesias in the left 2nd and 3rd toes and also describes numbness along the medial aspect of the left foot, lateral aspect of the left foot.  Describes intermittent pain into the right lower extremity as well as the left lower extremity, but notes that the left lower extremity is worse than the right.    Neurological: She is alert and oriented to person, place, and time.  Skin: Skin is warm and  dry.  Psychiatric: She has a normal mood and affect.     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  MIS approach.  Anisten Tomassi M 04/21/2013, 10:19 AM

## 2013-04-22 ENCOUNTER — Encounter (HOSPITAL_COMMUNITY)
Admission: RE | Admit: 2013-04-22 | Discharge: 2013-04-22 | Disposition: A | Discharge status: Home / Self Care | Payer: Worker's Compensation | Source: Ambulatory Visit | Attending: Specialist | Admitting: Specialist

## 2013-04-22 NOTE — Progress Notes (Signed)
Attempted to make contact with patient at number listed without success regarding PAT

## 2013-04-23 ENCOUNTER — Inpatient Hospital Stay (HOSPITAL_COMMUNITY): Admission: RE | Admit: 2013-04-23 | Payer: Self-pay | Source: Ambulatory Visit

## 2013-04-25 ENCOUNTER — Encounter (HOSPITAL_COMMUNITY): Admission: RE | Payer: Self-pay | Source: Ambulatory Visit

## 2013-04-25 ENCOUNTER — Ambulatory Visit (HOSPITAL_COMMUNITY): Admission: RE | Admit: 2013-04-25 | Payer: Worker's Compensation | Source: Ambulatory Visit | Admitting: Specialist

## 2013-04-25 SURGERY — MICRODISCECTOMY LUMBAR LAMINECTOMY
Anesthesia: General

## 2013-04-29 NOTE — H&P (Signed)
Patient examined and lab reviewed with Vernon PA-C. 

## 2013-08-24 ENCOUNTER — Encounter (HOSPITAL_COMMUNITY): Payer: Self-pay | Admitting: Emergency Medicine

## 2013-08-24 ENCOUNTER — Emergency Department (HOSPITAL_COMMUNITY): Payer: Medicaid Other

## 2013-08-24 ENCOUNTER — Emergency Department (HOSPITAL_COMMUNITY)
Admission: EM | Admit: 2013-08-24 | Discharge: 2013-08-24 | Disposition: A | Discharge status: Home / Self Care | Payer: Medicaid Other | Attending: Emergency Medicine | Admitting: Emergency Medicine

## 2013-08-24 DIAGNOSIS — Z79899 Other long term (current) drug therapy: Secondary | ICD-10-CM | POA: Insufficient documentation

## 2013-08-24 DIAGNOSIS — J40 Bronchitis, not specified as acute or chronic: Secondary | ICD-10-CM

## 2013-08-24 DIAGNOSIS — M2550 Pain in unspecified joint: Secondary | ICD-10-CM | POA: Insufficient documentation

## 2013-08-24 DIAGNOSIS — J9801 Acute bronchospasm: Secondary | ICD-10-CM

## 2013-08-24 DIAGNOSIS — J209 Acute bronchitis, unspecified: Secondary | ICD-10-CM | POA: Insufficient documentation

## 2013-08-24 DIAGNOSIS — R509 Fever, unspecified: Secondary | ICD-10-CM | POA: Insufficient documentation

## 2013-08-24 DIAGNOSIS — IMO0001 Reserved for inherently not codable concepts without codable children: Secondary | ICD-10-CM | POA: Insufficient documentation

## 2013-08-24 MED ORDER — IPRATROPIUM BROMIDE 0.02 % IN SOLN: 0.5000 mg | Freq: Once | RESPIRATORY_TRACT | Status: DC

## 2013-08-24 MED ORDER — ALBUTEROL SULFATE HFA 108 (90 BASE) MCG/ACT IN AERS
INHALATION_SPRAY | RESPIRATORY_TRACT | Status: AC
  Filled 2013-08-24: qty 6.7

## 2013-08-24 MED ORDER — IPRATROPIUM BROMIDE 0.02 % IN SOLN
0.5000 mg | Freq: Once | RESPIRATORY_TRACT | Status: AC
  Administered 2013-08-24: 0.5 mg via RESPIRATORY_TRACT
  Filled 2013-08-24: qty 2.5

## 2013-08-24 MED ORDER — ALBUTEROL SULFATE HFA 108 (90 BASE) MCG/ACT IN AERS: 2.0000 | INHALATION_SPRAY | Freq: Once | RESPIRATORY_TRACT | Status: AC

## 2013-08-24 MED ORDER — HYDROCOD POLST-CHLORPHEN POLST 10-8 MG/5ML PO LQCR: 5.0000 mL | Freq: Two times a day (BID) | ORAL | Status: DC | PRN

## 2013-08-24 MED ORDER — PREDNISONE 20 MG PO TABS
60.0000 mg | ORAL_TABLET | Freq: Once | ORAL | Status: AC
  Administered 2013-08-24: 60 mg via ORAL
  Filled 2013-08-24: qty 3

## 2013-08-24 MED ORDER — PREDNISONE 20 MG PO TABS: ORAL_TABLET | ORAL | Status: DC

## 2013-08-24 MED ORDER — ALBUTEROL SULFATE (2.5 MG/3ML) 0.083% IN NEBU: 2.5000 mg | INHALATION_SOLUTION | RESPIRATORY_TRACT | Status: DC | PRN

## 2013-08-24 MED ORDER — ALBUTEROL SULFATE (5 MG/ML) 0.5% IN NEBU
5.0000 mg | INHALATION_SOLUTION | Freq: Once | RESPIRATORY_TRACT | Status: AC
  Administered 2013-08-24: 5 mg via RESPIRATORY_TRACT
  Filled 2013-08-24: qty 1

## 2013-08-24 MED ORDER — ACETAMINOPHEN 325 MG PO TABS
650.0000 mg | ORAL_TABLET | Freq: Once | ORAL | Status: AC
  Administered 2013-08-24: 650 mg via ORAL
  Filled 2013-08-24: qty 2

## 2013-08-24 MED ORDER — AZITHROMYCIN 250 MG PO TABS: ORAL_TABLET | ORAL | Status: DC

## 2013-08-24 NOTE — ED Notes (Signed)
Pt from home c/o of chest congestion, body aches, fever since Friday. With productive cough that is yellow and purple. No recent travels.

## 2013-08-24 NOTE — ED Provider Notes (Signed)
Medical screening examination/treatment/procedure(s) were performed by non-physician practitioner and as supervising physician I was immediately available for consultation/collaboration.  Megan E Docherty, MD 08/24/13 2233 

## 2013-08-24 NOTE — ED Notes (Signed)
Gave apple juice

## 2013-08-24 NOTE — ED Notes (Signed)
Patient is alert and oriented x3.  She was given DC instructions and follow up visit instructions.  Patient gave verbal understanding. She was DC ambulatory under her own power to home.  V/S stable.  He was not showing any signs of distress on DC 

## 2013-08-24 NOTE — ED Provider Notes (Signed)
CSN: [id number]     Arrival date & time 08/24/13  1733 History   First MD Initiated Contact with Patient 08/24/13 1806     Chief Complaint  Patient presents with  . Generalized Body Aches  . Fever   (Consider location/radiation/quality/duration/timing/severity/associated sxs/prior Treatment) HPI Comments: 42 year old Hispanic female presents to the emergency department with her daughter who is translating for her complaining of chest congestion, productive cough with yellow and purple mucous and body aches x3 days. Admits to associated subjective fever and chills. States her throat is sore and ears feel clogged. She has tried taking TheraFlu and Robitussin without relief. Admits to associated shortness of breath and wheezing. Denies chest pain. No history of asthma. Denies any sick contacts or recent travel.  Patient is a 42 y.o. female presenting with fever. The history is provided by the patient and a relative. The history is limited by a language barrier. A language interpreter was used.  Fever Associated symptoms: chills, cough and myalgias   Associated symptoms: no chest pain     Past Medical History  Diagnosis Date  . Back pain    Past Surgical History  Procedure Laterality Date  . Finger surgery    . Abdominal hysterectomy     History reviewed. No pertinent family history. History  Substance Use Topics  . Smoking status: Never Smoker   . Smokeless tobacco: Never Used  . Alcohol Use: No   OB History   Grav Para Term Preterm Abortions TAB SAB Ect Mult Living                 Review of Systems  Constitutional: Positive for fever and chills.  Respiratory: Positive for cough, shortness of breath and wheezing.   Cardiovascular: Negative for chest pain.  Musculoskeletal: Positive for arthralgias and myalgias.  All other systems reviewed and are negative.    Allergies  Review of patient's allergies indicates not on file.  Home Medications   Current Outpatient Rx   Name  Route  Sig  Dispense  Refill  . gabapentin (NEURONTIN) 300 MG capsule   Oral   Take 300 mg by mouth 4 (four) times daily.          Marland Kitchen guaiFENesin-dextromethorphan (ROBITUSSIN DM) 100-10 MG/5ML syrup   Oral   Take 5 mLs by mouth 3 (three) times daily as needed for cough.         . pseudoephedrine-acetaminophen (TYLENOL SINUS) 30-500 MG TABS   Oral   Take 1 tablet by mouth every 4 (four) hours as needed (FLU SYMPTOMS).          BP 137/81  Pulse 110  Temp(Src) 98.9 F (37.2 C) (Oral)  Resp 20  SpO2 96%  LMP 05/30/2012 Physical Exam  Nursing note and vitals reviewed. Constitutional: She is oriented to person, place, and time. She appears well-developed and well-nourished. No distress.  HENT:  Head: Normocephalic and atraumatic.  Nose: Mucosal edema present.  Mouth/Throat: Uvula is midline and mucous membranes are normal. Posterior oropharyngeal erythema present. No oropharyngeal exudate or posterior oropharyngeal edema.  Post nasal drip present.  Eyes: Conjunctivae are normal.  Neck: Normal range of motion. Neck supple.  Cardiovascular: Regular rhythm, normal heart sounds and normal pulses.  Tachycardia present.   Pulmonary/Chest: Effort normal. No accessory muscle usage. No respiratory distress. She has no decreased breath sounds. She has wheezes (scattered expiratory). She has rhonchi ( scattered, L>R).  Harsh cough present.  Abdominal: Soft. Bowel sounds are normal. There is no tenderness.  Musculoskeletal: Normal range of motion. She exhibits no edema.  Neurological: She is alert and oriented to person, place, and time.  Skin: Skin is warm and dry. She is not diaphoretic.  Psychiatric: She has a normal mood and affect. Her behavior is normal.    ED Course  Procedures (including critical care time) Labs Review Labs Reviewed - No data to display Imaging Review Dg Chest 2 View  08/24/2013   CLINICAL DATA:  Chest pain, cough.  EXAM: CHEST  2 VIEW  COMPARISON:   July 02, 2012.  FINDINGS: The heart size and mediastinal contours are within normal limits. Both lungs are clear. The visualized skeletal structures are unremarkable.  IMPRESSION: No active cardiopulmonary disease.   Electronically Signed   By: Roque Lias M.D.   On: 08/24/2013 18:57    EKG Interpretation   None       MDM   1. Bronchitis   2. Bronchospasm     Patient with productive cough, subjective fever and chills, body aches. Afebrile in the emergency department, no apparent distress. Wheezing and rhonchi heard on exam. Will obtain chest x-ray to rule out pneumonia and give DuoNeb treatment. 7:54 PM Patient reports no improvement with initial breathing treatment. Wheezes/ronchi still present, but with clinical improvement. CXR clear. Will give another albuterol treatment, 60 mg prednisone and reassess. 8:35 PM Very minimal improvement with above regimen, however clinical improvement noted. She will be discharged home with 3 days of prednisone, albuterol inhaler, tussionex and azithromycin for bronchitis. Return precautions discussed. Patient states understanding of treatment care plan and is agreeable.   Trevor Mace, PA-C 08/24/13 2037

## 2013-09-09 ENCOUNTER — Emergency Department (HOSPITAL_COMMUNITY)
Admission: EM | Admit: 2013-09-09 | Discharge: 2013-09-10 | Disposition: A | Discharge status: Home / Self Care | Payer: Medicaid Other | Attending: Emergency Medicine | Admitting: Emergency Medicine

## 2013-09-09 ENCOUNTER — Encounter (HOSPITAL_COMMUNITY): Payer: Self-pay | Admitting: Emergency Medicine

## 2013-09-09 ENCOUNTER — Emergency Department (HOSPITAL_COMMUNITY): Payer: Medicaid Other

## 2013-09-09 DIAGNOSIS — R109 Unspecified abdominal pain: Secondary | ICD-10-CM | POA: Insufficient documentation

## 2013-09-09 DIAGNOSIS — R509 Fever, unspecified: Secondary | ICD-10-CM | POA: Insufficient documentation

## 2013-09-09 DIAGNOSIS — M549 Dorsalgia, unspecified: Secondary | ICD-10-CM | POA: Insufficient documentation

## 2013-09-09 DIAGNOSIS — J9801 Acute bronchospasm: Secondary | ICD-10-CM | POA: Insufficient documentation

## 2013-09-09 DIAGNOSIS — R51 Headache: Secondary | ICD-10-CM | POA: Insufficient documentation

## 2013-09-09 DIAGNOSIS — H9209 Otalgia, unspecified ear: Secondary | ICD-10-CM | POA: Insufficient documentation

## 2013-09-09 DIAGNOSIS — Z3202 Encounter for pregnancy test, result negative: Secondary | ICD-10-CM | POA: Insufficient documentation

## 2013-09-09 DIAGNOSIS — J029 Acute pharyngitis, unspecified: Secondary | ICD-10-CM | POA: Insufficient documentation

## 2013-09-09 DIAGNOSIS — R5381 Other malaise: Secondary | ICD-10-CM | POA: Insufficient documentation

## 2013-09-09 DIAGNOSIS — J40 Bronchitis, not specified as acute or chronic: Secondary | ICD-10-CM

## 2013-09-09 DIAGNOSIS — R300 Dysuria: Secondary | ICD-10-CM | POA: Insufficient documentation

## 2013-09-09 DIAGNOSIS — Z79899 Other long term (current) drug therapy: Secondary | ICD-10-CM | POA: Insufficient documentation

## 2013-09-09 DIAGNOSIS — J3489 Other specified disorders of nose and nasal sinuses: Secondary | ICD-10-CM | POA: Insufficient documentation

## 2013-09-09 LAB — BASIC METABOLIC PANEL
BUN: 7 mg/dL (ref 6–23)
CO2: 25 mEq/L (ref 19–32)
Calcium: 9.1 mg/dL (ref 8.4–10.5)
Creatinine, Ser: 0.73 mg/dL (ref 0.50–1.10)
GFR calc non Af Amer: >90 mL/min (ref >90)
Glucose, Bld: 157 mg/dL — ABNORMAL HIGH (ref 70–99)
Sodium: 138 mEq/L (ref 135–145)

## 2013-09-09 LAB — CBC
HCT: 37.6 % (ref 36.0–46.0)
Hemoglobin: 12.8 g/dL (ref 12.0–15.0)
MCH: 29.7 pg (ref 26.0–34.0)
MCHC: 34 g/dL (ref 30.0–36.0)
MCV: 87.2 fL (ref 78.0–100.0)
RBC: 4.31 MIL/uL (ref 3.87–5.11)
RDW: 14.3 % (ref 11.5–15.5)

## 2013-09-09 LAB — PREGNANCY, URINE: Preg Test, Ur: NEGATIVE

## 2013-09-09 MED ORDER — ONDANSETRON 4 MG PO TBDP
4.0000 mg | ORAL_TABLET | Freq: Once | ORAL | Status: AC
  Administered 2013-09-09: 4 mg via ORAL
  Filled 2013-09-09: qty 1

## 2013-09-09 MED ORDER — ALBUTEROL SULFATE (5 MG/ML) 0.5% IN NEBU
2.5000 mg | INHALATION_SOLUTION | RESPIRATORY_TRACT | Status: DC
  Administered 2013-09-09: 2.5 mg via RESPIRATORY_TRACT
  Filled 2013-09-09: qty 0.5

## 2013-09-09 MED ORDER — IPRATROPIUM BROMIDE 0.02 % IN SOLN
0.5000 mg | RESPIRATORY_TRACT | Status: DC
  Administered 2013-09-09: 0.5 mg via RESPIRATORY_TRACT
  Filled 2013-09-09: qty 2.5

## 2013-09-09 NOTE — ED Notes (Signed)
Pt reports that she was given and inhaler and has a neb treatment machine at home, last used at 1600, has not helped the pain or ShOB

## 2013-09-09 NOTE — ED Provider Notes (Signed)
CSN: [id number]     Arrival date & time 09/09/13  1956 History   First MD Initiated Contact with Patient 09/09/13 2206     Chief Complaint  Patient presents with  . Shortness of Breath  . Chest Pain    HPI  [name] is a 42 y.o. female with a PMH of back pain who presents to the ED for evaluation of SOB and chest pain.  She also has multiple other complaints.  History was provided by the patient and her family using phone interpreter.  Patient states that she has had a cough, SOB, and chest pain for the past 19 days.  She was seen on 08/24/13 for similar symptoms.  She was diagnosed with bronchitis.  She was given a prescription for Tussionex, Alubterol, azithromycin, and Prednisone.  She has had white sputum with a few streaks a "pink tinge" a few days ago but no gross hemoptysis.  She states she was doing better after discharge until the past few days when she ran out of her medications (antibiotic, prednisone, Tussionex) and her symptoms "came back."  She continues to use her Albuterol with her last use around 16:00 today.  Her chest pain is located diffusely across her chest and is described as a "burning sensation."  She has sharp mid-sternal chest pain which is worse with coughing.  Patient actively coughing throughout history.  She has had a subjective fever, generalized headache, sore throat, myalgias, rhinorrhea, ear fullness, abdominal pain, and dysuria.  She denies any vaginal discharge or bleeding. She also has pain in her upper back from coughing in her "lungs."  Her abdominal pain is diffuse but more localized in the middle lower pelvis.  Her LNMP was October 19th.  Patient denies any history of cardiac disease/conditions or clotting/DVT/PE.  No recent travel or surgeries.     Past Medical History  Diagnosis Date  . Back pain    Past Surgical History  Procedure Laterality Date  . Finger surgery    . Abdominal hysterectomy     History reviewed. No pertinent family  history. History  Substance Use Topics  . Smoking status: Never Smoker   . Smokeless tobacco: Never Used  . Alcohol Use: No   OB History   Grav Para Term Preterm Abortions TAB SAB Ect Mult Living                 Review of Systems  Constitutional: Positive for fever (subjective). Negative for chills, activity change, appetite change and fatigue.  HENT: Positive for congestion, ear pain, rhinorrhea and sore throat (from coughing). Negative for trouble swallowing and voice change.   Eyes: Negative for photophobia and visual disturbance.  Respiratory: Positive for cough, shortness of breath and wheezing. Negative for choking, chest tightness and stridor.   Cardiovascular: Positive for chest pain. Negative for palpitations and leg swelling.  Gastrointestinal: Positive for abdominal pain. Negative for nausea, vomiting, diarrhea, constipation and blood in stool.  Genitourinary: Positive for dysuria. Negative for hematuria, vaginal bleeding, vaginal discharge, difficulty urinating and vaginal pain.  Musculoskeletal: Positive for back pain and myalgias. Negative for arthralgias, gait problem, joint swelling and neck pain.  Skin: Negative for rash.  Neurological: Positive for headaches. Negative for dizziness, syncope, weakness, light-headedness and numbness.    Allergies  Review of patient's allergies indicates no known allergies.  Home Medications   Current Outpatient Rx  Name  Route  Sig  Dispense  Refill  . albuterol (PROVENTIL) (2.5 MG/3ML) 0.083% nebulizer solution  Nebulization   Take 3 mLs (2.5 mg total) by nebulization every 4 (four) hours as needed for wheezing.   30 vial   0   . chlorpheniramine-HYDROcodone (TUSSIONEX PENNKINETIC ER) 10-8 MG/5ML LQCR   Oral   Take 5 mLs by mouth every 12 (twelve) hours as needed.   115 mL   0   . gabapentin (NEURONTIN) 300 MG capsule   Oral   Take 300 mg by mouth 4 (four) times daily.          Marland Kitchen guaiFENesin-dextromethorphan  (ROBITUSSIN DM) 100-10 MG/5ML syrup   Oral   Take 5 mLs by mouth 3 (three) times daily as needed for cough.         . predniSONE (DELTASONE) 20 MG tablet      2 tabs po daily x 3 days   6 tablet   0   . pseudoephedrine-acetaminophen (TYLENOL SINUS) 30-500 MG TABS   Oral   Take 1 tablet by mouth every 4 (four) hours as needed (FLU SYMPTOMS).          BP 121/100  Pulse 90  Temp(Src) 98.8 F (37.1 C) (Oral)  Resp 22  Ht 5\' 1"  (1.549 m)  Wt 260 lb (117.935 kg)  BMI 49.15 kg/m2  SpO2 99%  LMP 05/30/2012  Filed Vitals:   09/09/13 2003 09/09/13 2254 09/09/13 2300 09/10/13 0040  BP: 154/90 121/100  124/91  Pulse: 105 90  88  Temp: 100.4 F (38 C) 98.8 F (37.1 C)  98.6 F (37 C)  TempSrc: Oral Oral  Oral  Resp: 18 22  20   Height: 5\' 1"  (1.549 m)     Weight: 260 lb (117.935 kg)     SpO2: 95% 99% 96% 100%    Physical Exam  Nursing note and vitals reviewed. Constitutional: She is oriented to person, place, and time. She appears well-developed and well-nourished. No distress.  HENT:  Head: Normocephalic and atraumatic.  Right Ear: External ear normal.  Left Ear: External ear normal.  Nose: Nose normal.  Mouth/Throat: Oropharynx is clear and moist. No oropharyngeal exudate.  No tenderness to the scalp throughout.  TM's gray and translucent bilaterally  Eyes: Conjunctivae and EOM are normal. Pupils are equal, round, and reactive to light. Right eye exhibits no discharge. Left eye exhibits no discharge.  Neck: Normal range of motion. Neck supple.  Cardiovascular: Normal rate, regular rhythm, normal heart sounds and intact distal pulses.  Exam reveals no gallop and no friction rub.   No murmur heard. Dorsalis pedis pulses present bilaterally  Pulmonary/Chest: Effort normal. No respiratory distress. She has wheezes. She has no rales. She exhibits tenderness.  Patient constantly coughing throughout H&P.  She holds her head, abdomen, and upper back during coughing  intermittently.  Scattered inspiratory and expiratory wheezing throughout.  Tenderness to palpation to the mid-sternal chest and bilateral ribs.    Abdominal: Soft. Bowel sounds are normal. She exhibits no distension and no mass. There is no tenderness. There is no rebound and no guarding.  Musculoskeletal: Normal range of motion. She exhibits tenderness. She exhibits no edema.  No leg edema or calf tenderness bilaterally.  No thoracic or lumbar spinal tenderness.  Diffuse tenderness to palpation to the lateral sides of the thoracic back bilaterally.  Neurological: She is alert and oriented to person, place, and time.  Skin: Skin is warm and dry. She is not diaphoretic.    ED Course  Procedures (including critical care time) Labs Review Labs Reviewed  CBC -  Abnormal; Notable for the following:    WBC 12.8 (*)    All other components within normal limits  BASIC METABOLIC PANEL - Abnormal; Notable for the following:    Glucose, Bld 157 (*)    All other components within normal limits  PRO B NATRIURETIC PEPTIDE  URINALYSIS, ROUTINE W REFLEX MICROSCOPIC  PREGNANCY, URINE  POCT I-STAT TROPONIN I   Imaging Review Dg Chest Port 1 View  09/09/2013   CLINICAL DATA:  Chest pain and shortness of breath; cough  EXAM: PORTABLE CHEST - 1 VIEW  COMPARISON:  August 24, 2013  FINDINGS: The lungs are clear. The heart size and pulmonary vascularity are normal. No adenopathy. No pneumothorax. There is degenerative change in the thoracic spine. No bone lesions.  IMPRESSION: No edema or consolidation.   Electronically Signed   By: Bretta Bang M.D.   On: 09/09/2013 20:52    EKG Interpretation     Ventricular Rate:  109 PR Interval:  136 QRS Duration: 80 QT Interval:  325 QTC Calculation: 438 R Axis:   43 Text Interpretation:  Sinus tachycardia           DG Chest Port 1 View (Final result)  Result time: 09/09/13 20:52:46    Procedure changed from [facility] Chest 2 View       Final result  by Rad Results In Interface (09/09/13 20:52:46)    Narrative:   CLINICAL DATA: Chest pain and shortness of breath; cough  EXAM: PORTABLE CHEST - 1 VIEW  COMPARISON: August 24, 2013  FINDINGS: The lungs are clear. The heart size and pulmonary vascularity are normal. No adenopathy. No pneumothorax. There is degenerative change in the thoracic spine. No bone lesions.  IMPRESSION: No edema or consolidation.   Electronically Signed By: Bretta Bang M.D. On: 09/09/2013 20:52         MDM   1. Bronchitis     [name] is a 42 y.o. female with a PMH of back pain who presents to the ED for evaluation of SOB and chest pain.  Chest x-ray, UA, urine pregnancy, troponin, CBC, BMP, and BNP ordered.  Duoneb ordered.     Rechecks  12:36 AM = Patient resting comfortably.  She has no SOB or chest pain.  States she feels much better after breathing treatment.  Lungs clear to auscultation.  Patient states she is out of her albuterol now and would like a refill.     Etiology of multiple complaints possibly due to bronchitis. Patient constantly coughing throughout exam.  Patient had relief in her symptoms after a Duo neb.  Her abdominal pain, headache, back pain, and chest pain is likely due to coughing.  Patient's chest x-ray was negative for an acute cardiopulmonary process.  Her EKG was negative for any acute ischemic changes.  Her Wells Score is 0.  Patient was instructed to follow-up with a PCP as soon as possible.  Patient was in agreement with discharge and plan.     Final impressions: 1. Bronchitis     Luiz Iron PA-C   This patient was discussed with Dr. Geralyn Flash, PA-C 09/12/13 [phone]

## 2013-09-09 NOTE — ED Notes (Signed)
Pt reports ShOB and chest pain x12 days, evaluated here on 10/12 and dx with bronchitis.  Pt reports increase in CP and ShOB tonight and feeling lightheaded.  Pt speaks English poorly, daughter translating at this time.

## 2013-09-10 LAB — URINALYSIS, ROUTINE W REFLEX MICROSCOPIC
Bilirubin Urine: NEGATIVE
Glucose, UA: NEGATIVE mg/dL
Hgb urine dipstick: NEGATIVE
Specific Gravity, Urine: 1.03 (ref 1.005–1.030)
Urobilinogen, UA: 1 mg/dL (ref 0.0–1.0)
pH: 6.5 (ref 5.0–8.0)

## 2013-09-10 MED ORDER — ALBUTEROL SULFATE (2.5 MG/3ML) 0.083% IN NEBU: 2.5000 mg | INHALATION_SOLUTION | RESPIRATORY_TRACT | Status: DC | PRN

## 2013-09-10 MED ORDER — HYDROCOD POLST-CHLORPHEN POLST 10-8 MG/5ML PO LQCR: 5.0000 mL | Freq: Two times a day (BID) | ORAL | Status: DC | PRN

## 2013-09-12 NOTE — ED Provider Notes (Signed)
Medical screening examination/treatment/procedure(s) were performed by non-physician practitioner and as supervising physician I was immediately available for consultation/collaboration.    Gilda Crease, MD 09/12/13 [phone]

## 2013-09-13 ENCOUNTER — Telehealth (HOSPITAL_COMMUNITY): Payer: Self-pay | Admitting: Emergency Medicine

## 2013-10-06 ENCOUNTER — Emergency Department (INDEPENDENT_AMBULATORY_CARE_PROVIDER_SITE_OTHER)
Admission: EM | Admit: 2013-10-06 | Discharge: 2013-10-06 | Disposition: A | Discharge status: Home / Self Care | Payer: Medicaid Other | Source: Home / Self Care | Attending: Family Medicine | Admitting: Family Medicine

## 2013-10-06 ENCOUNTER — Encounter (HOSPITAL_COMMUNITY): Payer: Self-pay | Admitting: Emergency Medicine

## 2013-10-06 DIAGNOSIS — R05 Cough: Secondary | ICD-10-CM

## 2013-10-06 MED ORDER — PREDNISONE 10 MG PO TABS: 50.0000 mg | ORAL_TABLET | Freq: Every day | ORAL | Status: DC

## 2013-10-06 MED ORDER — ALBUTEROL SULFATE HFA 108 (90 BASE) MCG/ACT IN AERS: 2.0000 | INHALATION_SPRAY | RESPIRATORY_TRACT | Status: AC | PRN

## 2013-10-06 MED ORDER — FLUTICASONE PROPIONATE 50 MCG/ACT NA SUSP: 2.0000 | Freq: Every day | NASAL | Status: AC

## 2013-10-06 MED ORDER — RANITIDINE HCL 150 MG PO TABS: 150.0000 mg | ORAL_TABLET | Freq: Two times a day (BID) | ORAL | Status: DC

## 2013-10-06 NOTE — ED Provider Notes (Signed)
CSN: [id number]     Arrival date & time 10/06/13  1947 History   First MD Initiated Contact with Patient 10/06/13 2105     Chief Complaint  Patient presents with  . Cough  . Headache   (Consider location/radiation/quality/duration/timing/severity/associated sxs/prior Treatment) HPI Comments: Pt with same sx for 2 months; seen in ER in October x2 for same. Has seen pcp for same.  Wants refill of tussionex. Is out of albuterol.   Patient is a 42 y.o. female presenting with cough. The history is provided by the patient and a relative. A language interpreter was used (family members).  Cough Cough characteristics:  Non-productive Severity:  Severe Onset quality:  Gradual Duration:  3 days Timing:  Constant Progression:  Unchanged Chronicity:  Recurrent Smoker: no   Relieved by:  Cough suppressants Worsened by:  Lying down Associated symptoms: chills, fever, rhinorrhea, sinus congestion and sore throat   Associated symptoms: no shortness of breath     Past Medical History  Diagnosis Date  . Back pain    Past Surgical History  Procedure Laterality Date  . Finger surgery  2011  . Tubal ligation  2004   Family History  Problem Relation Age of Onset  . Kidney failure Mother   . Heart attack Father    History  Substance Use Topics  . Smoking status: Never Smoker   . Smokeless tobacco: Never Used  . Alcohol Use: No   OB History   Grav Para Term Preterm Abortions TAB SAB Ect Mult Living                 Review of Systems  Constitutional: Positive for fever and chills.  HENT: Positive for rhinorrhea, sinus pressure and sore throat.   Respiratory: Positive for cough. Negative for shortness of breath.   Gastrointestinal: Negative for nausea and vomiting.    Allergies  Review of patient's allergies indicates no known allergies.  Home Medications   Current Outpatient Rx  Name  Route  Sig  Dispense  Refill  . guaiFENesin-dextromethorphan (ROBITUSSIN DM) 100-10 MG/5ML  syrup   Oral   Take 5 mLs by mouth 3 (three) times daily as needed for cough.         Marland Kitchen albuterol (PROVENTIL HFA;VENTOLIN HFA) 108 (90 BASE) MCG/ACT inhaler   Inhalation   Inhale 2 puffs into the lungs every 4 (four) hours as needed for wheezing or shortness of breath.   1 Inhaler   0   . albuterol (PROVENTIL) (2.5 MG/3ML) 0.083% nebulizer solution   Nebulization   Take 3 mLs (2.5 mg total) by nebulization every 4 (four) hours as needed for wheezing.   75 mL   0   . fluticasone (FLONASE) 50 MCG/ACT nasal spray   Each Nare   Place 2 sprays into both nostrils daily.   16 g   2   . gabapentin (NEURONTIN) 300 MG capsule   Oral   Take 300 mg by mouth 4 (four) times daily.          . predniSONE (DELTASONE) 10 MG tablet   Oral   Take 5 tablets (50 mg total) by mouth daily.   15 tablet   0   . pseudoephedrine-acetaminophen (TYLENOL SINUS) 30-500 MG TABS   Oral   Take 1 tablet by mouth every 4 (four) hours as needed (FLU SYMPTOMS).         . ranitidine (ZANTAC) 150 MG tablet   Oral   Take 1 tablet (150 mg total)  by mouth 2 (two) times daily.   60 tablet   0    BP 102/72  Pulse 85  Temp(Src) 99.1 F (37.3 C) (Oral)  Resp 20  SpO2 100%  LMP 09/09/2013 Physical Exam  Constitutional: She appears well-developed and well-nourished. No distress.  HENT:  Right Ear: Tympanic membrane, external ear and ear canal normal.  Left Ear: Tympanic membrane, external ear and ear canal normal.  Mouth/Throat: Oropharynx is clear and moist.  Cardiovascular: Normal rate and regular rhythm.   Pulmonary/Chest: Effort normal and breath sounds normal. No respiratory distress.  Frequent coughing  Lymphadenopathy:       Head (right side): No submental, no submandibular and no tonsillar adenopathy present.       Head (left side): No submental, no submandibular and no tonsillar adenopathy present.    She has no cervical adenopathy.    ED Course  Procedures (including critical care  time) Labs Review Labs Reviewed - No data to display Imaging Review No results found.  EKG Interpretation    Date/Time:    Ventricular Rate:    PR Interval:    QRS Duration:   QT Interval:    QTC Calculation:   R Axis:     Text Interpretation:              MDM   1. Cough   persistent cough for 2 months. Controlled when pt takes tussionex. Respiratory vs GI cause. Rx zantac 150mg  BID #60, albuterol HFA inhaler 2 puffs every 4 hours prn, rx flonase 2 sprays each nostril daily # 1 bottle, refill x2, and prednisone 50mg  daily for 3 days. F/u with pcp.       Cathlyn Parsons, NP 10/06/13 2120

## 2013-10-06 NOTE — ED Notes (Addendum)
C/o cough and headache onset 3 days ago with chills an fever.  C/o her lungs hurting.  C/o chest and nasal congestion but mostly in her chest at night.

## 2013-10-07 NOTE — ED Provider Notes (Signed)
Medical screening examination/treatment/procedure(s) were performed by a resident physician or non-physician practitioner and as the supervising physician I was immediately available for consultation/collaboration.  Viren Lebeau, MD   Chanae Gemma S Olyn Landstrom, MD 10/07/13 0750 

## 2013-11-10 ENCOUNTER — Encounter (HOSPITAL_COMMUNITY): Payer: Self-pay | Admitting: Emergency Medicine

## 2013-11-10 ENCOUNTER — Emergency Department (HOSPITAL_COMMUNITY): Payer: Medicaid Other

## 2013-11-10 ENCOUNTER — Emergency Department (HOSPITAL_COMMUNITY)
Admission: EM | Admit: 2013-11-10 | Discharge: 2013-11-11 | Disposition: A | Discharge status: Home / Self Care | Payer: Medicaid Other | Attending: Emergency Medicine | Admitting: Emergency Medicine

## 2013-11-10 ENCOUNTER — Other Ambulatory Visit: Payer: Self-pay | Admitting: Specialist

## 2013-11-10 DIAGNOSIS — M79605 Pain in left leg: Secondary | ICD-10-CM

## 2013-11-10 DIAGNOSIS — M79609 Pain in unspecified limb: Secondary | ICD-10-CM | POA: Insufficient documentation

## 2013-11-10 DIAGNOSIS — IMO0002 Reserved for concepts with insufficient information to code with codable children: Secondary | ICD-10-CM | POA: Insufficient documentation

## 2013-11-10 DIAGNOSIS — M7989 Other specified soft tissue disorders: Secondary | ICD-10-CM

## 2013-11-10 DIAGNOSIS — Z791 Long term (current) use of non-steroidal anti-inflammatories (NSAID): Secondary | ICD-10-CM | POA: Insufficient documentation

## 2013-11-10 DIAGNOSIS — R52 Pain, unspecified: Secondary | ICD-10-CM | POA: Insufficient documentation

## 2013-11-10 DIAGNOSIS — E669 Obesity, unspecified: Secondary | ICD-10-CM | POA: Insufficient documentation

## 2013-11-10 DIAGNOSIS — M549 Dorsalgia, unspecified: Secondary | ICD-10-CM | POA: Insufficient documentation

## 2013-11-10 DIAGNOSIS — Z79899 Other long term (current) drug therapy: Secondary | ICD-10-CM | POA: Insufficient documentation

## 2013-11-10 LAB — CBC WITH DIFFERENTIAL/PLATELET
Basophils Absolute: 0 10*3/uL (ref 0.0–0.1)
HCT: 37.1 % (ref 36.0–46.0)
Hemoglobin: 12.2 g/dL (ref 12.0–15.0)
Lymphocytes Relative: 41 % (ref 12–46)
Monocytes Absolute: 0.5 10*3/uL (ref 0.1–1.0)
Neutro Abs: 4.4 10*3/uL (ref 1.7–7.7)
RDW: 14.1 % (ref 11.5–15.5)
WBC: 8.7 10*3/uL (ref 4.0–10.5)

## 2013-11-10 LAB — BASIC METABOLIC PANEL
CO2: 26 mEq/L (ref 19–32)
Chloride: 102 mEq/L (ref 96–112)
Creatinine, Ser: 0.73 mg/dL (ref 0.50–1.10)
GFR calc Af Amer: >90 mL/min (ref >90)
Glucose, Bld: 182 mg/dL — ABNORMAL HIGH (ref 70–99)
Sodium: 137 mEq/L (ref 135–145)

## 2013-11-10 LAB — D-DIMER, QUANTITATIVE: D-Dimer, Quant: 0.85 ug/mL-FEU — ABNORMAL HIGH (ref 0.00–0.48)

## 2013-11-10 MED ORDER — ENOXAPARIN SODIUM 100 MG/ML ~~LOC~~ SOLN
120.0000 mg | Freq: Once | SUBCUTANEOUS | Status: AC
  Administered 2013-11-10: 120 mg via SUBCUTANEOUS
  Filled 2013-11-10: qty 2

## 2013-11-10 MED ORDER — OXYCODONE-ACETAMINOPHEN 5-325 MG PO TABS: 1.0000 | ORAL_TABLET | Freq: Four times a day (QID) | ORAL | Status: DC | PRN

## 2013-11-10 MED ORDER — OXYCODONE-ACETAMINOPHEN 5-325 MG PO TABS
1.0000 | ORAL_TABLET | Freq: Once | ORAL | Status: AC
  Administered 2013-11-10: 1 via ORAL
  Filled 2013-11-10: qty 1

## 2013-11-10 NOTE — ED Notes (Signed)
Patient presents after going to the PMD office for left leg pain.  ?DVT  Requesting to have an ultrasound done.

## 2013-11-10 NOTE — ED Notes (Signed)
Pt c/o pain to left lower leg behind left knee. X's 3 days.

## 2013-11-10 NOTE — ED Provider Notes (Signed)
CSN: [id number]     Arrival date & time 11/10/13  1920 History   First MD Initiated Contact with Patient 11/10/13 2046     Chief Complaint  Patient presents with  . Leg Pain   (Consider location/radiation/quality/duration/timing/severity/associated sxs/prior Treatment) HPI  This is a 42 year old female who presents with left leg pain. She was sent by her primary care physician to rule out DVT. Patient reports injury in October of 2013. She reports that she has chronic pain in the left leg but over the last 2-3 days has had increasing pain and swelling. She denies any new injury. She denies any other risk factors for DVT including estrogen use, recent hospitalization, recent injury. She denies any chest pain or shortness of breath. Patient describes her pain is throughout her entire leg and radiating down the back. She does have a history of herniated disc.  Past Medical History  Diagnosis Date  . Back pain    Past Surgical History  Procedure Laterality Date  . Finger surgery  2011  . Tubal ligation  2004   Family History  Problem Relation Age of Onset  . Kidney failure Mother   . Heart attack Father    History  Substance Use Topics  . Smoking status: Never Smoker   . Smokeless tobacco: Never Used  . Alcohol Use: No   OB History   Grav Para Term Preterm Abortions TAB SAB Ect Mult Living                 Review of Systems  Constitutional: Negative for fever.  Respiratory: Negative for cough, chest tightness and shortness of breath.   Cardiovascular: Negative for chest pain.  Gastrointestinal: Negative for nausea, vomiting and abdominal pain.  Genitourinary: Negative for dysuria, urgency and frequency.  Musculoskeletal: Positive for back pain.       Left leg pain  Skin: Negative for wound.  Neurological: Negative for headaches.  Psychiatric/Behavioral: Negative for confusion.  All other systems reviewed and are negative.    Allergies  Review of patient's allergies  indicates no known allergies.  Home Medications   Current Outpatient Rx  Name  Route  Sig  Dispense  Refill  . albuterol (PROVENTIL HFA;VENTOLIN HFA) 108 (90 BASE) MCG/ACT inhaler   Inhalation   Inhale 2 puffs into the lungs every 4 (four) hours as needed for wheezing or shortness of breath.   1 Inhaler   0   . albuterol (PROVENTIL) (2.5 MG/3ML) 0.083% nebulizer solution   Nebulization   Take 3 mLs (2.5 mg total) by nebulization every 4 (four) hours as needed for wheezing.   75 mL   0   . fluticasone (FLONASE) 50 MCG/ACT nasal spray   Each Nare   Place 2 sprays into both nostrils daily.   16 g   2   . gabapentin (NEURONTIN) 300 MG capsule   Oral   Take 300 mg by mouth 4 (four) times daily.          Marland Kitchen guaiFENesin-dextromethorphan (ROBITUSSIN DM) 100-10 MG/5ML syrup   Oral   Take 5 mLs by mouth 3 (three) times daily as needed for cough.         Marland Kitchen HYDROcodone-acetaminophen (NORCO/VICODIN) 5-325 MG per tablet   Oral   Take 1 tablet by mouth every 6 (six) hours as needed for moderate pain.         . meloxicam (MOBIC) 15 MG tablet   Oral   Take 15 mg by mouth daily.         Marland Kitchen  predniSONE (DELTASONE) 10 MG tablet   Oral   Take 5 tablets (50 mg total) by mouth daily.   15 tablet   0   . pseudoephedrine-acetaminophen (TYLENOL SINUS) 30-500 MG TABS   Oral   Take 1 tablet by mouth every 4 (four) hours as needed (FLU SYMPTOMS).         . ranitidine (ZANTAC) 150 MG tablet   Oral   Take 1 tablet (150 mg total) by mouth 2 (two) times daily.   60 tablet   0   . oxyCODONE-acetaminophen (PERCOCET/ROXICET) 5-325 MG per tablet   Oral   Take 1 tablet by mouth every 6 (six) hours as needed for severe pain.   10 tablet   0    BP 131/81  Pulse 83  Temp(Src) 99.6 F (37.6 C) (Oral)  Resp 20  Ht 5\' 1"  (1.549 m)  Wt 268 lb 14.4 oz (121.972 kg)  BMI 50.83 kg/m2  SpO2 100% Physical Exam  Nursing note and vitals reviewed. Constitutional: She is oriented to person,  place, and time. She appears well-developed and well-nourished.  Obese  HENT:  Head: Normocephalic and atraumatic.  Eyes: Pupils are equal, round, and reactive to light.  Cardiovascular: Normal rate, regular rhythm and normal heart sounds.   Pulmonary/Chest: Effort normal and breath sounds normal. No respiratory distress. She has no wheezes.  Abdominal: Soft. Bowel sounds are normal. There is no tenderness.  Musculoskeletal:  1+ bilateral lower extremity edema that appear symmetric, tenderness to palpation over the posterior popliteal fossa and bruising noted to this area  Neurological: She is alert and oriented to person, place, and time.  5 out of 5 strength in all 4 extremities  Skin: Skin is warm and dry.  Psychiatric: She has a normal mood and affect.    ED Course  Procedures (including critical care time) Labs Review Labs Reviewed  BASIC METABOLIC PANEL - Abnormal; Notable for the following:    Glucose, Bld 182 (*)    All other components within normal limits  D-DIMER, QUANTITATIVE - Abnormal; Notable for the following:    D-Dimer, Quant 0.85 (*)    All other components within normal limits  CBC WITH DIFFERENTIAL   Imaging Review Dg Knee Complete 4 Views Left  11/10/2013   CLINICAL DATA:  Left leg pain behind the knee for 3 days  EXAM: LEFT KNEE - COMPLETE 4+ VIEW  COMPARISON:  None.  FINDINGS: There is no evidence of fracture, dislocation, or joint effusion. There is no evidence of arthropathy or other focal bone abnormality. Soft tissues are unremarkable.  IMPRESSION: Negative.   Electronically Signed   By: Elige Ko   On: 11/10/2013 22:15    EKG Interpretation   None       MDM   1. Acute leg pain, left    Patient presents with left lower extremity pain. She is nontoxic-appearing on exam. Given history, have suspicion for possible sciatica. However, patient is somewhat obese and it is difficult to appreciate asymmetric swelling of the lower extremities. She does  have tenderness to palpation of the left lower extremity. We'll obtain a d-dimer. Dimer is positive at 0.85. I'm unable to obtain an ultrasound at this time.  Patient will be given a dose of Lovenox and will return tomorrow for ultrasound. Patient will be given Percocet for her pain.  Patient given strict return precautions.  After history, exam, and medical workup I feel the patient has been appropriately medically screened and is safe for  discharge home. Pertinent diagnoses were discussed with the patient. Patient was given return precautions.     Shon Baton, MD 11/10/13 [phone]

## 2013-11-10 NOTE — ED Notes (Signed)
Left leg 18" at the knee, 16 3/4" mid calf, 12" at the ankle  Right leg 17 3/4" at the knee, 14 1/4" mid calf, 11 1/4" at the ankle

## 2013-11-10 NOTE — ED Notes (Signed)
C/o pain behind the left knee.  Leg larger than the right.

## 2013-11-11 ENCOUNTER — Ambulatory Visit: Payer: Self-pay

## 2013-11-11 ENCOUNTER — Ambulatory Visit (HOSPITAL_COMMUNITY)
Admission: RE | Admit: 2013-11-11 | Discharge: 2013-11-11 | Disposition: A | Discharge status: Home / Self Care | Payer: Worker's Compensation | Source: Ambulatory Visit | Attending: Emergency Medicine | Admitting: Emergency Medicine

## 2013-11-11 DIAGNOSIS — M79609 Pain in unspecified limb: Secondary | ICD-10-CM

## 2013-11-11 NOTE — Progress Notes (Signed)
Spoke With Arthor Captain PA, regarding pharmacist call reporting he did not fill Oxycodone script from ED visit on 12.29.30 as patient had a Norco script filled yesterday from another provider. Pharmacist will call patient and educate her she cant have multiple narcotic prescriptions .No further CM needs.

## 2013-11-11 NOTE — Progress Notes (Signed)
Incoming call from [employer] [phone].He reports patient had a NORCO script filled yesterday 12.29..14 and is questioning her script from [facility] ED for  Oxycodone.CM completed a correction Form and will place in white folder in fast track for PA review.No further CM needs.

## 2013-11-11 NOTE — Progress Notes (Signed)
*  Preliminary Results* Left lower extremity venous duplex completed. Study was technically limited due to patient body habitus and depth of vessels. Visualized veins of the left lower extremity are negative for deep vein thrombosis. There is no evidence of left Baker's cyst.  11/11/2013 9:41 AM  Gertie Fey, RVT, RDCS, RDMS

## 2013-11-11 NOTE — Progress Notes (Deleted)
Spoke with PA Arthor Captain PA regarding pharmacy call regarding narcotics.Plan-This CM will call Pharmacist back based on plan review of EPIC with PA Arthor Captain.Pharmacist reports he will call Patient and explain as narcotic dispensed yesterday they cant dispense Oxycodone script from [facility] ED visit on 11/11/13.No further CM needs.Correction sheet placed in Fast Track folder.

## 2014-05-05 ENCOUNTER — Emergency Department (HOSPITAL_BASED_OUTPATIENT_CLINIC_OR_DEPARTMENT_OTHER)
Admission: EM | Admit: 2014-05-05 | Discharge: 2014-05-05 | Disposition: A | Discharge status: Home / Self Care | Payer: Medicaid Other | Attending: Emergency Medicine | Admitting: Emergency Medicine

## 2014-05-05 ENCOUNTER — Encounter (HOSPITAL_BASED_OUTPATIENT_CLINIC_OR_DEPARTMENT_OTHER): Payer: Self-pay | Admitting: Emergency Medicine

## 2014-05-05 DIAGNOSIS — Z79899 Other long term (current) drug therapy: Secondary | ICD-10-CM | POA: Insufficient documentation

## 2014-05-05 DIAGNOSIS — IMO0002 Reserved for concepts with insufficient information to code with codable children: Secondary | ICD-10-CM | POA: Insufficient documentation

## 2014-05-05 DIAGNOSIS — M549 Dorsalgia, unspecified: Secondary | ICD-10-CM

## 2014-05-05 DIAGNOSIS — M545 Low back pain, unspecified: Secondary | ICD-10-CM | POA: Insufficient documentation

## 2014-05-05 DIAGNOSIS — G8929 Other chronic pain: Secondary | ICD-10-CM

## 2014-05-05 MED ORDER — HYDROCODONE-ACETAMINOPHEN 5-325 MG PO TABS
2.0000 | ORAL_TABLET | Freq: Once | ORAL | Status: AC
  Administered 2014-05-05: 2 via ORAL
  Filled 2014-05-05: qty 2

## 2014-05-05 MED ORDER — CYCLOBENZAPRINE HCL 10 MG PO TABS: 10.0000 mg | ORAL_TABLET | Freq: Two times a day (BID) | ORAL | Status: DC | PRN

## 2014-05-05 NOTE — ED Notes (Signed)
Chart reviewed and care assumed. 

## 2014-05-05 NOTE — Discharge Instructions (Signed)
04/09/14 hydrocodone 5-325 mg #90 Dr. Gershon MusselNaiping Xu 04/13/14 hydrocodone 10-325 mg #90 Dr. Lerry Linerwight Williams 04/13/14 hydrocodone 10-325 mg #90 Dr. Lerry Linerwight Williams 04/27/14 hydrocodone 7.5-325 mg #30 Dr. Dr. Julio Sickssei-Bonsu 04/28/14 hydrocodone 5-325 mg #21 Dr. Gershon MusselNaiping Xu  FOLLOW UP WITH DR. Quintella ReichertHOOPER FOR FURTHER MANAGEMENT OF CHRONIC BACK PAIN.  Dolor De Espalda Crnico (Chronic Back Pain)  Cuando el dolor en la espalda dura ms de 3 meses, se denomina dolor de espalda crnico. Las personas que sufren dolor de espalda crnico generalmente pasan por perodos en los que es ms intenso (brotes).  CAUSAS  El dolor de espalda crnico puede estar originado en el desgaste degeneracin) de las diferentes estructuras de la espalda. Estas estructuras incluyen:  Los huesos de la columna vertebral (vrtebras) y las articulaciones rodean la mdula espinal y las races nerviosas (facetas).  Hay un tejido fibroso y fuerte que conecta las vrtebras (ligamentos). La degeneracin de estas estructuras provoca presin Jones Apparel Groupsobre los nervios. Esto puede causar Engineer, manufacturing systemsun dolor permanente.  INSTRUCCIONES PARA EL CUIDADO EN EL HOGAR   Evite encorvarse, levantar mucho peso, permanecer sentado por Con-waymucho tiempo, y las actividades que puedan empeorar el problema.  Tome breves perodos de descanso a travs del da para reducir Chief Technology Officerel dolor durante los brotes. Recostarse o Personal assistantpermanecer de pie generalmente es mejor que permanecer sentado para Lawyerdescansar.  Tome slo medicamentos de venta libre o recetados, segn las indicaciones del mdico. SOLICITE ATENCIN MDICA DE INMEDIATO SI:  Siente debilidad intensa o adormecimiento en una de sus piernas o pies.  Tiene dificultad para controlar la vejiga o el intestino.  Presenta nuseas, vmitos, dolor abdominal, falta de aire o desmayos. Document Released: 10/30/2005 Document Revised: 01/22/2012 [facility] Patient Information 2015 [place], [place]. This information is not intended to replace advice given to you  by your health care provider. Make sure you discuss any questions you have with your health care provider.

## 2014-05-05 NOTE — ED Provider Notes (Signed)
CSN: [id number]     Arrival date & time 05/05/14  1315 History   First MD Initiated Contact with Patient 05/05/14 1403     Chief Complaint  Patient presents with  . Back Pain     (Consider location/radiation/quality/duration/timing/severity/associated sxs/prior Treatment) Patient is a 43 y.o. female presenting with back pain. The history is provided by the patient. A language interpreter was used.  Back Pain Location:  Lumbar spine Quality:  Stiffness Stiffness is present:  All day Pain severity:  Moderate Associated symptoms: no bladder incontinence, no bowel incontinence, no leg pain, no numbness and no perianal numbness   Associated symptoms comment:  Presents with chronic back pain and states she is out of her medications. She regularly takes hydrocodone and has been referred back to her primary care physician from Dr. Otelia SergeantNitka for chronic pain management.    Past Medical History  Diagnosis Date  . Back pain    Past Surgical History  Procedure Laterality Date  . Finger surgery  2011  . Tubal ligation  2004   Family History  Problem Relation Age of Onset  . Kidney failure Mother   . Heart attack Father    History  Substance Use Topics  . Smoking status: Never Smoker   . Smokeless tobacco: Never Used  . Alcohol Use: No   OB History   Grav Para Term Preterm Abortions TAB SAB Ect Mult Living                 Review of Systems  Gastrointestinal: Negative for bowel incontinence.  Genitourinary: Negative for bladder incontinence.  Musculoskeletal: Positive for back pain.  Neurological: Negative for numbness.      Allergies  Review of patient's allergies indicates no known allergies.  Home Medications   Prior to Admission medications   Medication Sig Start Date End Date Taking? Authorizing Provider  albuterol (PROVENTIL HFA;VENTOLIN HFA) 108 (90 BASE) MCG/ACT inhaler Inhale 2 puffs into the lungs every 4 (four) hours as needed for wheezing or shortness of breath.  10/06/13   Cathlyn ParsonsAngela M Kabbe, NP  albuterol (PROVENTIL) (2.5 MG/3ML) 0.083% nebulizer solution Take 3 mLs (2.5 mg total) by nebulization every 4 (four) hours as needed for wheezing. 09/10/13   Jillyn LedgerJessica K Palmer, PA-C  cyclobenzaprine (FLEXERIL) 10 MG tablet Take 1 tablet (10 mg total) by mouth 2 (two) times daily as needed for muscle spasms. 05/05/14   Normon Pettijohn A Ilka Lovick, PA-C  fluticasone (FLONASE) 50 MCG/ACT nasal spray Place 2 sprays into both nostrils daily. 10/06/13   Cathlyn ParsonsAngela M Kabbe, NP  gabapentin (NEURONTIN) 300 MG capsule Take 300 mg by mouth 4 (four) times daily.  08/13/13   Historical Provider, MD  guaiFENesin-dextromethorphan (ROBITUSSIN DM) 100-10 MG/5ML syrup Take 5 mLs by mouth 3 (three) times daily as needed for cough.    Historical Provider, MD  HYDROcodone-acetaminophen (NORCO/VICODIN) 5-325 MG per tablet Take 1 tablet by mouth every 6 (six) hours as needed for moderate pain.    Historical Provider, MD  meloxicam (MOBIC) 15 MG tablet Take 15 mg by mouth daily.    Historical Provider, MD  oxyCODONE-acetaminophen (PERCOCET/ROXICET) 5-325 MG per tablet Take 1 tablet by mouth every 6 (six) hours as needed for severe pain. 11/10/13   Shon Batonourtney F Horton, MD  predniSONE (DELTASONE) 10 MG tablet Take 5 tablets (50 mg total) by mouth daily. 10/06/13   Cathlyn ParsonsAngela M Kabbe, NP  pseudoephedrine-acetaminophen (TYLENOL SINUS) 30-500 MG TABS Take 1 tablet by mouth every 4 (four) hours as needed (FLU  SYMPTOMS).    Historical Provider, MD  ranitidine (ZANTAC) 150 MG tablet Take 1 tablet (150 mg total) by mouth 2 (two) times daily. 10/06/13   Cathlyn ParsonsAngela M Kabbe, NP   BP 139/79  Pulse 93  Temp(Src) 97.9 F (36.6 C) (Oral)  Resp 20  Ht 5\' 1"  (1.549 m)  Wt 268 lb (121.564 kg)  BMI 50.66 kg/m2  SpO2 99% Physical Exam  Constitutional: She is oriented to person, place, and time. She appears well-developed and well-nourished.  Neck: Normal range of motion.  Cardiovascular: Intact distal pulses.   Pulmonary/Chest:  Effort normal.  Musculoskeletal: Normal range of motion.  Lower lumbar tenderness without swelling or discoloration.   Neurological: She is alert and oriented to person, place, and time.  Skin: Skin is warm and dry.    ED Course  Procedures (including critical care time) Labs Review Labs Reviewed - No data to display  Imaging Review No results found.   EKG Interpretation None      MDM   Final diagnoses:  Chronic back pain    The patient's record was reviewed in the [place] Controlled Substance database and narcotic prescriptions were multiple, 2 #90 Hydrocodone Rx's on the same day. This was discussed with the patient and she was informed that we would not write further narcotic pain relievers. She was understanding and asked for the dates and amounts given on the report in the last one month.     Arnoldo HookerShari A Valrie Jia, PA-C 05/08/14 2104
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
## 2014-05-08 NOTE — ED Provider Notes (Signed)
Medical screening examination/treatment/procedure(s) were performed by non-physician practitioner and as supervising physician I was immediately available for consultation/collaboration.   EKG Interpretation None        Melanie Belfi, MD 05/08/14 2118 

## 2014-07-21 ENCOUNTER — Emergency Department (HOSPITAL_BASED_OUTPATIENT_CLINIC_OR_DEPARTMENT_OTHER)
Admission: EM | Admit: 2014-07-21 | Discharge: 2014-07-21 | Disposition: A | Discharge status: Home / Self Care | Payer: Medicaid Other | Attending: Emergency Medicine | Admitting: Emergency Medicine

## 2014-07-21 ENCOUNTER — Encounter (HOSPITAL_BASED_OUTPATIENT_CLINIC_OR_DEPARTMENT_OTHER): Payer: Self-pay | Admitting: Emergency Medicine

## 2014-07-21 DIAGNOSIS — Z791 Long term (current) use of non-steroidal anti-inflammatories (NSAID): Secondary | ICD-10-CM | POA: Diagnosis not present

## 2014-07-21 DIAGNOSIS — Z79899 Other long term (current) drug therapy: Secondary | ICD-10-CM | POA: Insufficient documentation

## 2014-07-21 DIAGNOSIS — R059 Cough, unspecified: Secondary | ICD-10-CM | POA: Insufficient documentation

## 2014-07-21 DIAGNOSIS — J069 Acute upper respiratory infection, unspecified: Secondary | ICD-10-CM | POA: Insufficient documentation

## 2014-07-21 DIAGNOSIS — IMO0002 Reserved for concepts with insufficient information to code with codable children: Secondary | ICD-10-CM | POA: Diagnosis not present

## 2014-07-21 DIAGNOSIS — R05 Cough: Secondary | ICD-10-CM | POA: Insufficient documentation

## 2014-07-21 MED ORDER — FLUTICASONE PROPIONATE 50 MCG/ACT NA SUSP: 2.0000 | Freq: Every day | NASAL | Status: DC

## 2014-07-21 MED ORDER — ALBUTEROL SULFATE HFA 108 (90 BASE) MCG/ACT IN AERS
2.0000 | INHALATION_SPRAY | Freq: Once | RESPIRATORY_TRACT | Status: AC
  Administered 2014-07-21: 2 via RESPIRATORY_TRACT
  Filled 2014-07-21: qty 6.7

## 2014-07-21 NOTE — Discharge Instructions (Signed)
Use albuterol inhaler every 4-6 hours as directed. Use Flonase as directed.  Vaporizadores de Soil scientist fro Clinical research associate) Los vaporizadores ayudan a Paramedic los sntomas de la tos y Metallurgist. Agregan humedad al aire, lo que fluidifica el moco y lo hace menos espeso. Facilitan la respiracin y favorecen la eliminacin de secreciones. Los vaporizadores de aire fro no provocan quemaduras serias [employer] de aire caliente, que tambin se llaman humidificadores. No se ha probado que los vaporizadores mejoren el resfro. No debe usar un vaporizador si es Pharmacologist. INSTRUCCIONES PARA EL CUIDADO EN EL HOGAR  Siga las instrucciones para el uso del vaporizador que se encuentran en la caja.  Use solamente agua destilada en el vaporizador.  No use el vaporizador en forma continua. Esto puede formar moho o hacer que se desarrollen bacterias en el vaporizador.  Limpie el vaporizador cada vez que se use.  Lmpielo y squelo bien antes de guardarlo.  Deje de usarlo si los sntomas respiratorios empeoran. Document Released: 07/02/2013 Document Revised: 11/04/2013 [facility] Patient Information 2015 [place], [place]. This information is not intended to replace advice given to you by your health care provider. Make sure you discuss any questions you have with your health care provider.  Infeccin de las vas areas superiores en los adultos (Upper Respiratory Infection, Adult)  La infeccin respiratoria de las vas areas superiores se conoce tambin como resfro comn. Las vas areas superiores [employer] senos nasales, la garganta, la trquea, y los bronquios. Los bronquios son las vas areas que conducen el aire a los pulmones. La mayor parte de las personas mejora luego de una Force, Biomedical engineer los sntomas pueden durar [employer]. La tos residual puede durar ms. CAUSAS Varios tipos de virus pueden causar la infeccin de los tejidos que cubren las vas areas superiores. Los tejidos se  irritan y se inflaman y se originan secreciones. Tambin es frecuente la produccin de moco. El resfro es contagioso. El virus se disemina fcilmente a otras personas por contacto oral. Aqu se incluyen los besos, el compartir un vaso y el toser o Engineering geologist. Tambin puede diseminarse tocndose la boca o la [place] y luego tocando una superficie que luego tocan Economist.  SNTOMAS Los sntomas se desarrollan entre uno y [place] luego de Cytogeneticist en contacto con el virus. Pueden variar de Neomia Dear persona a otra. Incluyen:  Secrecin nasal.  Estornudos  Congestin nasal.  Irritacin de los senos nasales.  Dolor de Advertising copywriter.  Prdida de la voz (laringitis).  Tos.  Fatiga.  Dolores musculares.  Prdida del apetito.  Dolor de [place].  Fiebre no muy elevada. DIAGNSTICO Puede diagnosticarse a s mismo la infeccin respiratoria, segn los sntomas habituales, ya que la mayor parte de las personas se resfra dos o tres veces al ao. El profesional puede confirmarlo basndose en el examen fsico. Lo ms importante es que el profesional verifique que los sntomas no se deben a otra enfermedad como anginas, sinusitis, neumona, asma o epiglotitis. Para diagnosticar el resfrio comn, no es necesario que haga anlisis de [place], pruebas en la garganta o radiografas, pero en algunos casos puede ser de utilidad para excluir otros problemas ms graves. El mdico decidir si necesita otras pruebas. RIESGOS Y COMPLICACIONES Tendr mayor riesgo de sufrir un resfro grave si consume cigarrillos, sufre una enfermedad cardaca (como insuficiencia cardaca) o pulmonar crnica (como asma) o si tiene un debilitamiento del sistema inmunolgico. Las personas muy jvenes o muy mayores tienen riesgo de sufrir infecciones ms  graves. La sinusitis bacteriana, las infecciones del odo medio y la neumona bacteriana pueden complicar el resfro comn. El resfro puede exacerbar el asma y la enfermedad pulmonar  obstructiva crnica. En algunos casos estas complicaciones requieren la atencin en un servicio de emergencias y pueden poner en peligro la vida. PREVENCIN La mejor manera de protegerse para no contraer un resfro es Pharmacologist una buena higiene. Evite el contacto bucal o de las manos con personas con sntomas de resfro. Si se produce el contacto, lvese las manos con frecuencia. No hay pruebas firmes que indiquen que la vitamina C, la vitamina E, la equincea o la actividad fsica reduzcan las posibilidades de tener una infeccin. Sin embargo, siempre se recomienda Insurance account manager y Winferd Humphrey buena nutricin. TRATAMIENTO El tratamiento est dirigido a Consulting civil engineer sntomas. Esta enfermedad no tiene [place]. Los antibiticos no son eficaces, ya que esta infeccin la causa un virus y no una bacteria. El tratamiento incluye:  Aumente la ingesta de lquidos. Consumo de bebidas deportivas, que proporcionan electrolitos,azcares e hidratacin.  Inhale vapor caliente (de un vaporizador o de la ducha).  Tomar sopa de pollo u otros lquidos claros, y [employer] buena nutricin.  Descanse lo suficiente.  Haga grgaras o coma pastillas para [employer].  Control de la fiebre con ibuprofeno o acetaminofen, segn las indicaciones del mdico.  Aumento del uso del inhalador, si sufre asma. Las pastillas y los geles de zinc durante las primeras 24 horas de iniciado el resfro comn, pueden disminuir la duracin y Paramedic la gravedad de los sntomas. Los medicamentos para Chief Technology Officer pueden disminuir la fiebre, Paramedic los dolores musculares y Chief Technology Officer de Advertising copywriter. Se dispone de una gran variedad de medicamentos de venta libre para tratar la congestin y la secrecin nasal. El profesional podr recomendarle inhalantes para los otros sntomas. INSTRUCCIONES PARA EL CUIDADO DOMICILIARIO  Utilice los medicamentos de venta libre o de prescripcin para Chief Technology Officer, el malestar o la [place], segn se lo indique el  profesional que lo asiste.  Utilice un vaporizador caliente o inhale vapor, haciendo salir agua de la ducha para aumentar la humedad [place]. Esto mantendr las secreciones hmedas y Community education officer ms fcil respirar.  Beba gran cantidad de lquido para mantener la orina de tono claro o color amarillo plido.  Descanse todo lo que pueda.  Regrese a su trabajo cuando la temperatura se haya normalizado, o cuando el profesional que lo asiste se lo indique. Quizs sea necesario que permanezca en su casa durante un tiempo ms prolongado para Buyer, retail a Economist. Tambin puede utilizar un barbijo y ser cuidadoso con el lavado de manos para evitar la diseminacin del virus. SOLICITE ATENCIN MDICA SI:  Luego de los primeros das siente que empeora en vez de [place].  Necesita que Occupational psychologist brinde ms informacin relacionada con los medicamentos para [employer] sntomas.  Siente escalofros, le falta el aire o escupe moco de color marrn o rojo. Estos pueden ser sntomas de neumona.  Tiene una secrecin nasal de color amarillo o marrn, o siente dolor en el rostro, especialmente cuando se inclina hacia adelante. Estos pueden ser sntomas de sinusitis.  Tiene fiebre, siente el cuello hinchado, tiene dolor al tragar u observa manchas blancas en el fondo de la garganta. Estos pueden ser sntomas de angina por estreptococo. Romona Curls ATENCIN MDICA DE INMEDIATO SI:  Lance Muss.  Comienza a sentir Herbalist de cabeza intenso o persistente, dolor de odos, en el seno nasal o en  el pecho.  Tiene tos y esta se prolonga demasiado, tose y escupe sangre, la mucosidad habitual se modifica (si tiene una enfermedad pulmonar crnica) o respira con dificultad.  Siente rigidez en el cuello o dolor de cabeza intenso. Document Released: 08/09/2005 Document Revised: 01/22/2012 [facility] Patient Information 2015 [place], [place]. This information is not intended to replace advice given to  you by your health care provider. Make sure you discuss any questions you have with your health care provider.

## 2014-07-21 NOTE — Patient Instructions (Signed)
Instructed patient on the proper use of administering via aerochamber patient tolerated well

## 2014-07-21 NOTE — ED Notes (Signed)
Pt c/o URI symtpoms x 4 days

## 2014-07-21 NOTE — ED Provider Notes (Signed)
CSN: [id number]     Arrival date & time 07/21/14  2051 History   First MD Initiated Contact with Patient 07/21/14 2111     Chief Complaint  Patient presents with  . URI     (Consider location/radiation/quality/duration/timing/severity/associated sxs/prior Treatment) HPI Comments: Patient is a 43 year old female who presents to the emergency department complaining of cold symptoms x4 days. Patient reports her ears are itchy, she has nasal congestion and a "tickle" in the back of her throat. Admits to nonproductive cough and subjective fevers. Denies nausea or vomiting. Her children and husband are sick with similar symptoms. She has tried taking Robitussin with minimal relief.  Patient is a 43 y.o. female presenting with URI.  URI Presenting symptoms: congestion, cough and fever (subjective)     Past Medical History  Diagnosis Date  . Back pain    Past Surgical History  Procedure Laterality Date  . Finger surgery  2011  . Tubal ligation  2004   Family History  Problem Relation Age of Onset  . Kidney failure Mother   . Heart attack Father    History  Substance Use Topics  . Smoking status: Never Smoker   . Smokeless tobacco: Never Used  . Alcohol Use: No   OB History   Grav Para Term Preterm Abortions TAB SAB Ect Mult Living                 Review of Systems  Constitutional: Positive for fever (subjective).  HENT: Positive for congestion.   Respiratory: Positive for cough.   All other systems reviewed and are negative.     Allergies  Review of patient's allergies indicates no known allergies.  Home Medications   Prior to Admission medications   Medication Sig Start Date End Date Taking? Authorizing Provider  albuterol (PROVENTIL HFA;VENTOLIN HFA) 108 (90 BASE) MCG/ACT inhaler Inhale 2 puffs into the lungs every 4 (four) hours as needed for wheezing or shortness of breath. 10/06/13   Cathlyn Parsons, NP  albuterol (PROVENTIL) (2.5 MG/3ML) 0.083% nebulizer  solution Take 3 mLs (2.5 mg total) by nebulization every 4 (four) hours as needed for wheezing. 09/10/13   Jillyn Ledger, PA-C  cyclobenzaprine (FLEXERIL) 10 MG tablet Take 1 tablet (10 mg total) by mouth 2 (two) times daily as needed for muscle spasms. 05/05/14   Shari A Upstill, PA-C  fluticasone (FLONASE) 50 MCG/ACT nasal spray Place 2 sprays into both nostrils daily. 10/06/13   Cathlyn Parsons, NP  fluticasone (FLONASE) 50 MCG/ACT nasal spray Place 2 sprays into both nostrils daily. 07/21/14   Trevor Mace, PA-C  gabapentin (NEURONTIN) 300 MG capsule Take 300 mg by mouth 4 (four) times daily.  08/13/13   Historical Provider, MD  guaiFENesin-dextromethorphan (ROBITUSSIN DM) 100-10 MG/5ML syrup Take 5 mLs by mouth 3 (three) times daily as needed for cough.    Historical Provider, MD  HYDROcodone-acetaminophen (NORCO/VICODIN) 5-325 MG per tablet Take 1 tablet by mouth every 6 (six) hours as needed for moderate pain.    Historical Provider, MD  meloxicam (MOBIC) 15 MG tablet Take 15 mg by mouth daily.    Historical Provider, MD  oxyCODONE-acetaminophen (PERCOCET/ROXICET) 5-325 MG per tablet Take 1 tablet by mouth every 6 (six) hours as needed for severe pain. 11/10/13   Shon Baton, MD  predniSONE (DELTASONE) 10 MG tablet Take 5 tablets (50 mg total) by mouth daily. 10/06/13   Cathlyn Parsons, NP  pseudoephedrine-acetaminophen (TYLENOL SINUS) 30-500 MG TABS Take 1 tablet  by mouth every 4 (four) hours as needed (FLU SYMPTOMS).    Historical Provider, MD  ranitidine (ZANTAC) 150 MG tablet Take 1 tablet (150 mg total) by mouth 2 (two) times daily. 10/06/13   Cathlyn Parsons, NP   BP 145/90  Pulse 94  Temp(Src) 98.6 F (37 C)  Resp 16  Ht  (1.626 m)  Wt 252 lb (114.306 kg)  BMI 43.23 kg/m2  SpO2 99%  LMP 07/08/2014 Physical Exam  Nursing note and vitals reviewed. Constitutional: She is oriented to person, place, and time. She appears well-developed and well-nourished. No distress.   HENT:  Head: Normocephalic and atraumatic.  Mouth/Throat: Oropharynx is clear and moist. No oropharyngeal exudate.  Nasal congestion, mucosal edema. Postnasal drip. Bilateral tympanic membranes and ear canals normal.  Eyes: Conjunctivae and EOM are normal.  Neck: Normal range of motion. Neck supple.  Cardiovascular: Normal rate, regular rhythm and normal heart sounds.   Pulmonary/Chest: Effort normal and breath sounds normal. No respiratory distress. She has no wheezes.  Musculoskeletal: Normal range of motion. She exhibits no edema.  Lymphadenopathy:    She has no cervical adenopathy.  Neurological: She is alert and oriented to person, place, and time. No sensory deficit.  Skin: Skin is warm and dry.  Psychiatric: She has a normal mood and affect. Her behavior is normal.    ED Course  Procedures (including critical care time) Labs Review Labs Reviewed - No data to display  Imaging Review No results found.   EKG Interpretation None      MDM   Final diagnoses:  URI (upper respiratory infection)   Patient presenting with URI symptoms. She is nontoxic appearing and in no apparent distress. Afebrile, vital signs stable. Lungs clear. Oropharynx clear other than postnasal drip. Discussed symptomatic treatment. She is requesting an albuterol inhaler as this has helped her in the past. Will discharge with albuterol inhaler and prescription for Flonase. Stable for discharge. Return precautions given. Patient states understanding of treatment care plan and is agreeable.   Trevor Mace, PA-C 07/21/14 2139  Trevor Mace, PA-C 07/21/14 2142

## 2014-07-23 NOTE — ED Provider Notes (Signed)
Medical screening examination/treatment/procedure(s) were performed by non-physician practitioner and as supervising physician I was immediately available for consultation/collaboration.   EKG Interpretation None        Matthew Gentry, MD 07/23/14 1256 

## 2014-11-24 ENCOUNTER — Other Ambulatory Visit: Payer: Self-pay

## 2014-11-26 LAB — CYTOLOGY - PAP

## 2016-09-01 ENCOUNTER — Emergency Department (HOSPITAL_COMMUNITY): Payer: No Typology Code available for payment source

## 2016-09-01 ENCOUNTER — Encounter (HOSPITAL_COMMUNITY): Payer: Self-pay | Admitting: Emergency Medicine

## 2016-09-01 ENCOUNTER — Emergency Department (HOSPITAL_COMMUNITY)
Admission: EM | Admit: 2016-09-01 | Discharge: 2016-09-01 | Disposition: A | Discharge status: Home / Self Care | Payer: No Typology Code available for payment source | Attending: Emergency Medicine | Admitting: Emergency Medicine

## 2016-09-01 DIAGNOSIS — R1010 Upper abdominal pain, unspecified: Secondary | ICD-10-CM | POA: Diagnosis present

## 2016-09-01 DIAGNOSIS — Y939 Activity, unspecified: Secondary | ICD-10-CM | POA: Diagnosis not present

## 2016-09-01 DIAGNOSIS — Y92410 Unspecified street and highway as the place of occurrence of the external cause: Secondary | ICD-10-CM | POA: Diagnosis not present

## 2016-09-01 DIAGNOSIS — E119 Type 2 diabetes mellitus without complications: Secondary | ICD-10-CM | POA: Diagnosis not present

## 2016-09-01 DIAGNOSIS — R51 Headache: Secondary | ICD-10-CM | POA: Diagnosis not present

## 2016-09-01 DIAGNOSIS — R079 Chest pain, unspecified: Secondary | ICD-10-CM | POA: Insufficient documentation

## 2016-09-01 DIAGNOSIS — J45909 Unspecified asthma, uncomplicated: Secondary | ICD-10-CM | POA: Diagnosis not present

## 2016-09-01 DIAGNOSIS — I10 Essential (primary) hypertension: Secondary | ICD-10-CM | POA: Insufficient documentation

## 2016-09-01 DIAGNOSIS — R1084 Generalized abdominal pain: Secondary | ICD-10-CM | POA: Diagnosis not present

## 2016-09-01 DIAGNOSIS — Z79899 Other long term (current) drug therapy: Secondary | ICD-10-CM | POA: Diagnosis not present

## 2016-09-01 DIAGNOSIS — Y999 Unspecified external cause status: Secondary | ICD-10-CM | POA: Insufficient documentation

## 2016-09-01 HISTORY — DX: Essential (primary) hypertension: I10

## 2016-09-01 HISTORY — DX: Type 2 diabetes mellitus without complications: E11.9

## 2016-09-01 HISTORY — DX: Unspecified asthma, uncomplicated: J45.909

## 2016-09-01 LAB — COMPREHENSIVE METABOLIC PANEL
ALT: 20 U/L (ref 14–54)
AST: 26 U/L (ref 15–41)
Albumin: 3.6 g/dL (ref 3.5–5.0)
Alkaline Phosphatase: 58 U/L (ref 38–126)
Anion gap: 7 (ref 5–15)
BUN: 20 mg/dL (ref 6–20)
CHLORIDE: 96 mmol/L — AB (ref 101–111)
CO2: 29 mmol/L (ref 22–32)
CREATININE: 0.85 mg/dL (ref 0.44–1.00)
Calcium: 8.8 mg/dL — ABNORMAL LOW (ref 8.9–10.3)
GFR calc non Af Amer: >60 mL/min (ref >60)
GLUCOSE: 146 mg/dL — AB (ref 65–99)
Potassium: 3.2 mmol/L — ABNORMAL LOW (ref 3.5–5.1)
SODIUM: 132 mmol/L — AB (ref 135–145)
Total Bilirubin: 0.4 mg/dL (ref 0.3–1.2)
Total Protein: 7.1 g/dL (ref 6.5–8.1)

## 2016-09-01 LAB — CBC WITH DIFFERENTIAL/PLATELET
BASOS ABS: 0.2 10*3/uL — AB (ref 0.0–0.1)
Basophils Relative: 3 %
EOS ABS: 0.3 10*3/uL (ref 0.0–0.7)
Eosinophils Relative: 3 %
HCT: 32.8 % — ABNORMAL LOW (ref 36.0–46.0)
HEMOGLOBIN: 10.8 g/dL — AB (ref 12.0–15.0)
Lymphocytes Relative: 35 %
Lymphs Abs: 2.9 10*3/uL (ref 0.7–4.0)
MCH: 28.1 pg (ref 26.0–34.0)
MCHC: 32.9 g/dL (ref 30.0–36.0)
MCV: 85.2 fL (ref 78.0–100.0)
Monocytes Absolute: 0.7 10*3/uL (ref 0.1–1.0)
Monocytes Relative: 9 %
NEUTROS PCT: 50 %
Neutro Abs: 4.1 10*3/uL (ref 1.7–7.7)
Platelets: 349 10*3/uL (ref 150–400)
RBC: 3.85 MIL/uL — AB (ref 3.87–5.11)
RDW: 15.1 % (ref 11.5–15.5)
WBC: 8.2 10*3/uL (ref 4.0–10.5)

## 2016-09-01 MED ORDER — CYCLOBENZAPRINE HCL 10 MG PO TABS: 10.0000 mg | ORAL_TABLET | Freq: Two times a day (BID) | ORAL | 0 refills | Status: DC | PRN

## 2016-09-01 MED ORDER — IOPAMIDOL (ISOVUE-300) INJECTION 61%
100.0000 mL | Freq: Once | INTRAVENOUS | Status: AC | PRN
  Administered 2016-09-01: 100 mL via INTRAVENOUS
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
## 2016-09-01 NOTE — ED Notes (Signed)
Pt ambulatory to restroom

## 2017-01-26 ENCOUNTER — Ambulatory Visit: Payer: Self-pay

## 2017-02-06 ENCOUNTER — Encounter (HOSPITAL_COMMUNITY): Payer: Self-pay | Admitting: Emergency Medicine

## 2017-02-06 ENCOUNTER — Emergency Department (HOSPITAL_COMMUNITY)
Admission: EM | Admit: 2017-02-06 | Discharge: 2017-02-07 | Disposition: A | Discharge status: Home / Self Care | Payer: Medicaid Other | Attending: Emergency Medicine | Admitting: Emergency Medicine

## 2017-02-06 DIAGNOSIS — J45909 Unspecified asthma, uncomplicated: Secondary | ICD-10-CM | POA: Insufficient documentation

## 2017-02-06 DIAGNOSIS — Z8719 Personal history of other diseases of the digestive system: Secondary | ICD-10-CM

## 2017-02-06 DIAGNOSIS — Z79899 Other long term (current) drug therapy: Secondary | ICD-10-CM | POA: Insufficient documentation

## 2017-02-06 DIAGNOSIS — Z9889 Other specified postprocedural states: Secondary | ICD-10-CM | POA: Diagnosis not present

## 2017-02-06 DIAGNOSIS — E119 Type 2 diabetes mellitus without complications: Secondary | ICD-10-CM | POA: Insufficient documentation

## 2017-02-06 DIAGNOSIS — I10 Essential (primary) hypertension: Secondary | ICD-10-CM | POA: Insufficient documentation

## 2017-02-06 DIAGNOSIS — R1013 Epigastric pain: Secondary | ICD-10-CM | POA: Diagnosis not present

## 2017-02-06 DIAGNOSIS — R1012 Left upper quadrant pain: Secondary | ICD-10-CM | POA: Diagnosis present

## 2017-02-06 LAB — URINALYSIS, ROUTINE W REFLEX MICROSCOPIC
BILIRUBIN URINE: NEGATIVE
Glucose, UA: NEGATIVE mg/dL
KETONES UR: NEGATIVE mg/dL
Leukocytes, UA: NEGATIVE
Nitrite: NEGATIVE
Protein, ur: NEGATIVE mg/dL
SPECIFIC GRAVITY, URINE: 1.013 (ref 1.005–1.030)
pH: 6 (ref 5.0–8.0)

## 2017-02-06 LAB — COMPREHENSIVE METABOLIC PANEL
ALT: 21 U/L (ref 14–54)
ANION GAP: 7 (ref 5–15)
AST: 27 U/L (ref 15–41)
Albumin: 3.8 g/dL (ref 3.5–5.0)
Alkaline Phosphatase: 58 U/L (ref 38–126)
BUN: 10 mg/dL (ref 6–20)
CHLORIDE: 106 mmol/L (ref 101–111)
CO2: 26 mmol/L (ref 22–32)
Calcium: 9.1 mg/dL (ref 8.9–10.3)
Creatinine, Ser: 1 mg/dL (ref 0.44–1.00)
GFR calc non Af Amer: >60 mL/min (ref >60)
Glucose, Bld: 122 mg/dL — ABNORMAL HIGH (ref 65–99)
POTASSIUM: 4.1 mmol/L (ref 3.5–5.1)
Sodium: 139 mmol/L (ref 135–145)
Total Bilirubin: 0.6 mg/dL (ref 0.3–1.2)
Total Protein: 7.9 g/dL (ref 6.5–8.1)

## 2017-02-06 LAB — LIPASE, BLOOD: LIPASE: 16 U/L (ref 11–51)

## 2017-02-06 LAB — CBC
HCT: 35.1 % — ABNORMAL LOW (ref 36.0–46.0)
Hemoglobin: 11.3 g/dL — ABNORMAL LOW (ref 12.0–15.0)
MCH: 27 pg (ref 26.0–34.0)
MCHC: 32.2 g/dL (ref 30.0–36.0)
MCV: 84 fL (ref 78.0–100.0)
PLATELETS: 404 10*3/uL — AB (ref 150–400)
RBC: 4.18 MIL/uL (ref 3.87–5.11)
RDW: 15.4 % (ref 11.5–15.5)
WBC: 9.9 10*3/uL (ref 4.0–10.5)

## 2017-02-06 MED ORDER — ONDANSETRON 4 MG PO TBDP: 4.0000 mg | ORAL_TABLET | Freq: Once | ORAL | Status: DC | PRN

## 2017-02-06 MED ORDER — ONDANSETRON HCL 4 MG/2ML IJ SOLN
4.0000 mg | Freq: Once | INTRAMUSCULAR | Status: AC
  Administered 2017-02-06: 4 mg via INTRAVENOUS
  Filled 2017-02-06: qty 2

## 2017-02-06 MED ORDER — MORPHINE SULFATE (PF) 4 MG/ML IV SOLN
4.0000 mg | Freq: Once | INTRAVENOUS | Status: AC
  Administered 2017-02-06: 4 mg via INTRAVENOUS
  Filled 2017-02-06: qty 1

## 2017-02-06 MED ORDER — FENTANYL CITRATE (PF) 100 MCG/2ML IJ SOLN
50.0000 ug | INTRAMUSCULAR | Status: DC | PRN
  Administered 2017-02-06: 50 ug via INTRAVENOUS
  Filled 2017-02-06: qty 2

## 2017-02-06 NOTE — ED Triage Notes (Signed)
Pt reports upper abd pain for the last 2 weeks that worsened tonight. Pt reported to having hernia repair 6 months ago and was seen at surgeons office recently and ultrasound showed infection and drainage at hernia repair site.

## 2017-02-06 NOTE — ED Provider Notes (Signed)
[facility] DEPT Provider Note   CSN: [id number] Arrival date & time: 02/06/17  2148  By signing my name below, I, [name], attest that this documentation has been prepared under the direction and in the presence of Geoffery Lyons, MD. Electronically Signed: [name], ED Scribe. 02/06/17. 11:40 PM.   History   Chief Complaint Chief Complaint  Patient presents with  . Abdominal Pain    HPI [name] is a 46 y.o. female who presents to the Emergency Department complaining of gradually worsening upper left abdominal pain for the last three weeks. Pt has a PSHx of a hernia repair in 2016 and 2017. About three weeks ago pt [facility] for [facility] sx. She was dx with an infection from a rip in the mesh. She was prescribed antibiotics and only has ~ 4 left. Sx have worsened and she reports pain as a 10+ severity. Pt denies urgency, frequency, hematuria, dysuria, difficulty urinating, and constipation.   The history is provided by the patient and a relative. No language interpreter was used.    Past Medical History:  Diagnosis Date  . Asthma   . Back pain   . Diabetes mellitus without complication (HCC)   . Hypertension     There are no active problems to display for this patient.   Past Surgical History:  Procedure Laterality Date  . FINGER SURGERY  2011  . HERNIA REPAIR    . TUBAL LIGATION  2004    OB History    Gravida Para Term Preterm AB Living   1             SAB TAB Ectopic Multiple Live Births                   Home Medications    Prior to Admission medications   Medication Sig Start Date End Date Taking? Authorizing Provider  ADVAIR DISKUS 250-50 MCG/DOSE AEPB INHALE 1 PUFF BY MOUTH 2 TIMES DAILY 08/25/16   Historical Provider, MD  albuterol (PROVENTIL HFA;VENTOLIN HFA) 108 (90 BASE) MCG/ACT inhaler Inhale 2 puffs into the lungs every 4 (four) hours as needed for wheezing or shortness of breath. 10/06/13   Cathlyn Parsons, NP  albuterol (PROVENTIL) (2.5 MG/3ML) 0.083% nebulizer solution Take 3 mLs (2.5 mg total) by nebulization every 4 (four) hours as needed for wheezing. Patient not taking: Reported on 09/01/2016 09/10/13   Jillyn Ledger, PA-C  AMITIZA 24 MCG capsule Take 24 mcg by mouth 2 (two) times daily. 08/25/16   Historical Provider, MD  clonazePAM (KLONOPIN) 0.5 MG tablet Take 0.5 mg by mouth 2 (two) times daily. 08/24/16   Historical Provider, MD  cyclobenzaprine (FLEXERIL) 10 MG tablet Take 1 tablet (10 mg total) by mouth 2 (two) times daily as needed for muscle spasms. Patient not taking: Reported on 09/01/2016 05/05/14   Elpidio Anis, PA-C  cyclobenzaprine (FLEXERIL) 10 MG tablet Take 1 tablet (10 mg total) by mouth 2 (two) times daily as needed for muscle spasms. 09/01/16   Elpidio Anis, PA-C  fluticasone (FLONASE) 50 MCG/ACT nasal spray Place 2 sprays into both nostrils daily. Patient not taking: Reported on 09/01/2016 10/06/13   Cathlyn Parsons, NP  fluticasone [facility]) 50 MCG/ACT nasal spray Place 2 sprays into both nostrils daily. Patient not taking: Reported on 09/01/2016 07/21/14   Kathrynn Speed, PA-C  fluticasone [facility]) 50 MCG/ACT nasal spray 1 spray by Each Nare route daily as needed for allergies 07/21/14  Historical Provider, MD  furosemide (LASIX) 20 MG tablet Take 20 mg by mouth daily. 08/24/16   Historical Provider, MD  gabapentin (NEURONTIN) 800 MG tablet Take 800 mg by mouth 3 (three) times daily. 08/25/16   Historical Provider, MD  guaiFENesin-dextromethorphan (ROBITUSSIN DM) 100-10 MG/5ML syrup Take 5 mLs by mouth 3 (three) times daily as needed for cough.    Historical Provider, MD  ibuprofen (ADVIL,MOTRIN) 800 MG tablet Take 800 mg by mouth every 6 (six) hours as needed. for pain 08/01/16   Historical Provider, MD  lisinopril-hydrochlorothiazide (PRINZIDE,ZESTORETIC) 10-12.5 MG tablet Take 1 tablet by mouth daily. 08/25/16   Historical Provider, MD  meloxicam (MOBIC) 15 MG  tablet Take 15 mg by mouth daily.    Historical Provider, MD  omeprazole (PRILOSEC) 40 MG capsule Take 40 mg by mouth daily. 08/25/16   Historical Provider, MD  oxyCODONE (ROXICODONE) 15 MG immediate release tablet Take 15 mg by mouth 4 (four) times daily. 08/24/16   Historical Provider, MD  oxyCODONE-acetaminophen (PERCOCET/ROXICET) 5-325 MG per tablet Take 1 tablet by mouth every 6 (six) hours as needed for severe pain. Patient not taking: Reported on 09/01/2016 11/10/13   Shon Baton, MD  pravastatin (PRAVACHOL) 40 MG tablet Take 40 mg by mouth at bedtime. 08/25/16   Historical Provider, MD  predniSONE (DELTASONE) 10 MG tablet Take 5 tablets (50 mg total) by mouth daily. Patient not taking: Reported on 09/01/2016 10/06/13   Cathlyn Parsons, NP  pseudoephedrine-acetaminophen (TYLENOL SINUS) 30-500 MG TABS Take 1 tablet by mouth every 4 (four) hours as needed (FLU SYMPTOMS).    Historical Provider, MD  ranitidine (ZANTAC) 150 MG tablet Take 1 tablet (150 mg total) by mouth 2 (two) times daily. Patient not taking: Reported on 09/01/2016 10/06/13   Cathlyn Parsons, NP  RESTASIS 0.05 % ophthalmic emulsion INSTILL ONE DROP IN [facility] EYE TWICE DAILY 07/31/16   Historical Provider, MD  Vitamin D, Ergocalciferol, (DRISDOL) 50000 units CAPS capsule 1 (ONE) CAPSULE CAPSULE WEEKLY 08/25/16   Historical Provider, MD    Family History Family History  Problem Relation Age of Onset  . Kidney failure Mother   . Heart attack Father     Social History Social History  Substance Use Topics  . Smoking status: Never Smoker  . Smokeless tobacco: Never Used  . Alcohol use No     Allergies   Patient has no known allergies.   Review of Systems Review of Systems  Gastrointestinal: Positive for abdominal pain. Negative for constipation.  Genitourinary: Negative for difficulty urinating, dysuria, frequency and urgency.  All other systems reviewed and are negative.    Physical Exam Updated Vital  Signs BP (!) 153/96 (BP Location: Right Arm)   Pulse 99   Temp 98.9 F (37.2 C) (Oral)   Resp (!) 24   Ht 5\' 1"  (1.549 m)   Wt 224 lb (101.6 kg)   LMP 01/10/2017   SpO2 100%   Breastfeeding? Unknown   BMI 42.32 kg/m   Physical Exam  Constitutional: She appears well-developed and well-nourished.  HENT:  Head: Normocephalic and atraumatic.  Mouth/Throat: Oropharynx is clear and moist. No oropharyngeal exudate.  Eyes: Conjunctivae and EOM are normal. Pupils are equal, round, and reactive to light. Right eye exhibits no discharge. Left eye exhibits no discharge. No scleral icterus.  Neck: Normal range of motion. Neck supple. No JVD present. No tracheal deviation present.  Trachea is midline. No stridor or carotid bruits.   Cardiovascular: Normal rate, regular rhythm,  normal heart sounds and intact distal pulses.   No murmur heard. Pulmonary/Chest: Effort normal and breath sounds normal. No stridor. No respiratory distress. She has no wheezes. She has no rales.  Lungs CTA bilaterally.  Abdominal: Soft. Bowel sounds are normal. She exhibits no distension. There is tenderness. There is no rebound and no guarding.  TTP in all 4 quadrants, most notably in LUQ.  Musculoskeletal: Normal range of motion. She exhibits no edema or tenderness.  All compartments are soft. No palpable cords.   Lymphadenopathy:    She has no cervical adenopathy.  Neurological: She is alert. She has normal reflexes. She displays normal reflexes. She exhibits normal muscle tone.  Skin: Skin is warm and dry. Capillary refill takes less than 2 seconds.  Psychiatric: She has a normal mood and affect. Her behavior is normal.  Nursing note and vitals reviewed.    ED Treatments / Results  DIAGNOSTIC STUDIES: Oxygen Saturation is 100% on RA, normal by my interpretation.   COORDINATION OF CARE: 11:32 PM-Discussed next steps with pt which includes a a CAT scan. Pt verbalized understanding and is agreeable with the  plan.    Labs (all labs ordered are listed, but only abnormal results are displayed) Labs Reviewed  COMPREHENSIVE METABOLIC PANEL - Abnormal; Notable for the following:       Result Value   Glucose, Bld 122 (*)    All other components within normal limits  CBC - Abnormal; Notable for the following:    Hemoglobin 11.3 (*)    HCT 35.1 (*)    Platelets 404 (*)    All other components within normal limits  URINALYSIS, ROUTINE W REFLEX MICROSCOPIC - Abnormal; Notable for the following:    APPearance HAZY (*)    Hgb urine dipstick SMALL (*)    Bacteria, UA RARE (*)    Squamous Epithelial / LPF 6-30 (*)    All other components within normal limits  LIPASE, BLOOD    EKG  EKG Interpretation None       Radiology No results found.  Procedures Procedures (including critical care time)  Medications Ordered in ED Medications  ondansetron (ZOFRAN-ODT) disintegrating tablet 4 mg (not administered)  fentaNYL (SUBLIMAZE) injection 50 mcg (50 mcg Intravenous Given 02/06/17 2257)  ondansetron (ZOFRAN) injection 4 mg (4 mg Intravenous Given 02/06/17 2257)     Initial Impression / Assessment and Plan / ED Course  I have reviewed the triage vital signs and the nursing notes.  Pertinent labs & imaging results that were available during my care of the patient were reviewed by me and considered in my medical decision making (see chart for details).  Patient with history of ventral hernia repair performed by a surgeon at [facility]. She was found to have some sort of fluid collection around the mesh. She has been on 3 weeks of antibiotics, however her pain persists.  She is afebrile and nontoxic appearing in the emergency department. She has no white count. CT scan of the abdomen reveals a persistent fluid collection around the mesh. My advice was for this patient to follow-up with her surgeon. I see nothing emergent tonight that needs to be done.  She has expressed some displeasure with her  surgeon and wants a referral to our surgeons here and [facility]. She will be given the number for [facility] [place] [facility] and is to call to arrange a follow-up appointment. She will be given pain medication which she can take in the meantime.  Final Clinical Impressions(s) /  ED Diagnoses   Final diagnoses:  None    New Prescriptions New Prescriptions   No medications on file   I personally performed the services described in this documentation, which was scribed in my presence. The recorded information has been reviewed and is accurate.       Geoffery Lyonsouglas Dalphine Cowie, MD 02/07/17 [phone]

## 2017-02-07 ENCOUNTER — Emergency Department (HOSPITAL_COMMUNITY): Payer: Medicaid Other

## 2017-02-07 MED ORDER — HYDROCODONE-ACETAMINOPHEN 5-325 MG PO TABS: 1.0000 | ORAL_TABLET | Freq: Four times a day (QID) | ORAL | 0 refills | Status: DC | PRN

## 2017-02-07 MED ORDER — IOPAMIDOL (ISOVUE-300) INJECTION 61%
INTRAVENOUS | Status: AC
  Administered 2017-02-07: 100 mL
  Filled 2017-02-07: qty 100

## 2017-02-07 NOTE — Discharge Instructions (Signed)
Hydrocodone as prescribed as needed for pain.  Follow-up with general surgery. The contact information for [facility] has been provided in this discharge summary for you to call and make these arrangements.  Return to the emergency department if your symptoms significantly worsen or change.

## 2017-02-26 ENCOUNTER — Emergency Department (HOSPITAL_COMMUNITY): Payer: Medicaid Other

## 2017-02-26 ENCOUNTER — Encounter (HOSPITAL_COMMUNITY): Payer: Self-pay | Admitting: Emergency Medicine

## 2017-02-26 ENCOUNTER — Emergency Department (HOSPITAL_COMMUNITY)
Admission: EM | Admit: 2017-02-26 | Discharge: 2017-02-27 | Disposition: A | Discharge status: Home / Self Care | Payer: Medicaid Other | Attending: Emergency Medicine | Admitting: Emergency Medicine

## 2017-02-26 DIAGNOSIS — S7002XA Contusion of left hip, initial encounter: Secondary | ICD-10-CM

## 2017-02-26 DIAGNOSIS — Y929 Unspecified place or not applicable: Secondary | ICD-10-CM | POA: Diagnosis not present

## 2017-02-26 DIAGNOSIS — S79912A Unspecified injury of left hip, initial encounter: Secondary | ICD-10-CM | POA: Diagnosis present

## 2017-02-26 DIAGNOSIS — Z79899 Other long term (current) drug therapy: Secondary | ICD-10-CM | POA: Insufficient documentation

## 2017-02-26 DIAGNOSIS — W1839XA Other fall on same level, initial encounter: Secondary | ICD-10-CM | POA: Diagnosis not present

## 2017-02-26 DIAGNOSIS — I10 Essential (primary) hypertension: Secondary | ICD-10-CM | POA: Diagnosis not present

## 2017-02-26 DIAGNOSIS — E119 Type 2 diabetes mellitus without complications: Secondary | ICD-10-CM | POA: Insufficient documentation

## 2017-02-26 DIAGNOSIS — R51 Headache: Secondary | ICD-10-CM | POA: Diagnosis not present

## 2017-02-26 DIAGNOSIS — Y999 Unspecified external cause status: Secondary | ICD-10-CM | POA: Insufficient documentation

## 2017-02-26 DIAGNOSIS — J45909 Unspecified asthma, uncomplicated: Secondary | ICD-10-CM | POA: Insufficient documentation

## 2017-02-26 DIAGNOSIS — T148XXA Other injury of unspecified body region, initial encounter: Secondary | ICD-10-CM

## 2017-02-26 DIAGNOSIS — S0081XA Abrasion of other part of head, initial encounter: Secondary | ICD-10-CM | POA: Insufficient documentation

## 2017-02-26 DIAGNOSIS — Y9301 Activity, walking, marching and hiking: Secondary | ICD-10-CM | POA: Diagnosis not present

## 2017-02-26 DIAGNOSIS — W19XXXA Unspecified fall, initial encounter: Secondary | ICD-10-CM

## 2017-02-26 MED ORDER — HYDROCODONE-ACETAMINOPHEN 5-325 MG PO TABS
1.0000 | ORAL_TABLET | ORAL | Status: AC
  Administered 2017-02-27: 1 via ORAL
  Filled 2017-02-26: qty 1

## 2017-02-26 NOTE — ED Provider Notes (Signed)
[facility]-EMERGENCY DEPT Provider Note   CSN: [id number] Arrival date & time: 02/26/17  2118     History   Chief Complaint Chief Complaint  Patient presents with  . Fall    HPI [name] is a 46 y.o. female.  HPI Patient presents to the emergency room for evaluation of pain after fall. Patient was walking this evening from a restaurant. She stumbled while walking out and fell forward onto her face. Patient also is complaining of pain in her left hip. She is also having pain in her abdomen chest. She sustained an abrasion to her chin think she may have chipped her teeth. She is not sure if she lost consciousness. She denies any focal numbness or weakness. No vomiting. Past Medical History:  Diagnosis Date  . Asthma   . Back pain   . Diabetes mellitus without complication (HCC)   . Hypertension     There are no active problems to display for this patient.   Past Surgical History:  Procedure Laterality Date  . FINGER SURGERY  2011  . HERNIA REPAIR    . TUBAL LIGATION  2004    OB History    Gravida Para Term Preterm AB Living   1             SAB TAB Ectopic Multiple Live Births                   Home Medications    Prior to Admission medications   Medication Sig Start Date End Date Taking? Authorizing Provider  ADVAIR DISKUS 250-50 MCG/DOSE AEPB INHALE 1 PUFF BY MOUTH 2 TIMES DAILY 08/25/16   Historical Provider, MD  albuterol (PROVENTIL HFA;VENTOLIN HFA) 108 (90 BASE) MCG/ACT inhaler Inhale 2 puffs into the lungs every 4 (four) hours as needed for wheezing or shortness of breath. 10/06/13   Cathlyn Parsons, NP  albuterol (PROVENTIL) (2.5 MG/3ML) 0.083% nebulizer solution Take 3 mLs (2.5 mg total) by nebulization every 4 (four) hours as needed for wheezing. Patient not taking: Reported on 09/01/2016 09/10/13   Jillyn Ledger, PA-C  AMITIZA 24 MCG capsule Take 24 mcg by mouth 2 (two) times daily. 08/25/16   Historical Provider, MD  ciprofloxacin (CIPRO) 500 MG  tablet Take 500 mg by mouth daily with breakfast.    Historical Provider, MD  cyclobenzaprine (FLEXERIL) 10 MG tablet Take 1 tablet (10 mg total) by mouth 2 (two) times daily as needed for muscle spasms. Patient not taking: Reported on 09/01/2016 05/05/14   Elpidio Anis, PA-C  cyclobenzaprine (FLEXERIL) 10 MG tablet Take 1 tablet (10 mg total) by mouth 2 (two) times daily as needed for muscle spasms. 09/01/16   Elpidio Anis, PA-C  fluticasone (FLONASE) 50 MCG/ACT nasal spray Place 2 sprays into both nostrils daily. Patient not taking: Reported on 09/01/2016 10/06/13   Cathlyn Parsons, NP  fluticasone [facility]) 50 MCG/ACT nasal spray Place 2 sprays into both nostrils daily. Patient not taking: Reported on 09/01/2016 07/21/14   Kathrynn Speed, PA-C  fluticasone [facility]) 50 MCG/ACT nasal spray 1 spray by Each Nare route daily as needed for allergies 07/21/14   Historical Provider, MD  furosemide (LASIX) 20 MG tablet Take 20 mg by mouth daily. 08/24/16   Historical Provider, MD  gabapentin (NEURONTIN) 800 MG tablet Take 800 mg by mouth 3 (three) times daily. 08/25/16   Historical Provider, MD  guaiFENesin-dextromethorphan (ROBITUSSIN DM) 100-10 MG/5ML syrup Take 5 mLs by mouth 3 (three) times daily as needed  for cough.    Historical Provider, MD  HYDROcodone-acetaminophen (NORCO) 5-325 MG tablet Take 1-2 tablets by mouth every 6 (six) hours as needed. 02/07/17   Geoffery Lyons, MD  ibuprofen (ADVIL,MOTRIN) 800 MG tablet Take 800 mg by mouth every 6 (six) hours as needed. for pain 08/01/16   Historical Provider, MD  lisinopril-hydrochlorothiazide (PRINZIDE,ZESTORETIC) 10-12.5 MG tablet Take 1 tablet by mouth daily. 08/25/16   Historical Provider, MD  naproxen (NAPROSYN) 500 MG tablet Take 1 tablet (500 mg total) by mouth 2 (two) times daily. 02/27/17   Linwood Dibbles, MD  omeprazole (PRILOSEC) 40 MG capsule Take 40 mg by mouth daily. 08/25/16   Historical Provider, MD  oxyCODONE (ROXICODONE) 15 MG immediate release  tablet Take 15 mg by mouth 4 (four) times daily. 08/24/16   Historical Provider, MD  oxyCODONE-acetaminophen (PERCOCET/ROXICET) 5-325 MG per tablet Take 1 tablet by mouth every 6 (six) hours as needed for severe pain. Patient not taking: Reported on 09/01/2016 11/10/13   Shon Baton, MD  pravastatin (PRAVACHOL) 40 MG tablet Take 40 mg by mouth at bedtime. 08/25/16   Historical Provider, MD  predniSONE (DELTASONE) 10 MG tablet Take 5 tablets (50 mg total) by mouth daily. Patient not taking: Reported on 09/01/2016 10/06/13   Cathlyn Parsons, NP  ranitidine (ZANTAC) 150 MG tablet Take 1 tablet (150 mg total) by mouth 2 (two) times daily. Patient not taking: Reported on 09/01/2016 10/06/13   Cathlyn Parsons, NP  RESTASIS 0.05 % ophthalmic emulsion INSTILL ONE DROP IN [facility] EYE TWICE DAILY 07/31/16   Historical Provider, MD  traZODone (DESYREL) 50 MG tablet Take 50-100 mg by mouth at bedtime as needed for sleep.    Historical Provider, MD  Vitamin D, Ergocalciferol, (DRISDOL) 50000 units CAPS capsule 1 (ONE) CAPSULE CAPSULE WEEKLY 08/25/16   Historical Provider, MD    Family History Family History  Problem Relation Age of Onset  . Kidney failure Mother   . Heart attack Father     Social History Social History  Substance Use Topics  . Smoking status: Never Smoker  . Smokeless tobacco: Never Used  . Alcohol use No     Allergies   Patient has no known allergies.   Review of Systems Review of Systems  All other systems reviewed and are negative.    Physical Exam Updated Vital Signs BP (!) 124/105   Pulse 85   Temp 98.7 F (37.1 C) (Oral)   Resp 17   Ht  (1.549 m)   Wt 101.6 kg   LMP 02/07/2017 (Exact Date)   SpO2 99%   BMI 42.32 kg/m   Physical Exam  Constitutional: No distress.  Morbidly obese  HENT:  Head: Normocephalic.  Right Ear: External ear normal.  Left Ear: External ear normal.  Superficial abrasion of the chin, no jaw malocclusion, mild irregularity  to the enamel of the upper incisors  Eyes: Conjunctivae are normal. Right eye exhibits no discharge. Left eye exhibits no discharge. No scleral icterus.  Neck: Neck supple. No tracheal deviation present.  Cardiovascular: Normal rate.   Pulmonary/Chest: Effort normal. No stridor. No respiratory distress.  Abdominal: She exhibits no distension. There is generalized tenderness.  Large amount of tissue, Mild tenderness in the abdomen diffusely, no bruising  Musculoskeletal: She exhibits no edema.       Right shoulder: She exhibits no tenderness, no bony tenderness and no swelling.       Left shoulder: She exhibits no tenderness, no bony tenderness and  no swelling.       Right wrist: She exhibits no tenderness, no bony tenderness and no swelling.       Left wrist: She exhibits no tenderness, no bony tenderness and no swelling.       Right hip: She exhibits normal range of motion, no tenderness, no bony tenderness and no swelling.       Left hip: She exhibits tenderness and bony tenderness. She exhibits normal range of motion.       Right ankle: She exhibits no swelling. No tenderness.       Left ankle: She exhibits no swelling. No tenderness.       Cervical back: She exhibits tenderness. She exhibits no bony tenderness and no swelling.       Thoracic back: She exhibits no tenderness, no bony tenderness and no swelling.       Lumbar back: She exhibits no tenderness, no bony tenderness and no swelling.  Neurological: She is alert. Cranial nerve deficit: no gross deficits.  Skin: Skin is warm and dry. No rash noted.  Psychiatric: She has a normal mood and affect.  Nursing note and vitals reviewed.    ED Treatments / Results  Labs (all labs ordered are listed, but only abnormal results are displayed) Labs Reviewed - No data to display   Radiology Dg Chest 2 View  Result Date: 02/27/2017 CLINICAL DATA:  Fall, shortness of breath EXAM: CHEST  2 VIEW COMPARISON:  09/01/2016 FINDINGS: No focal  pulmonary infiltrate or effusion. Borderline cardiomegaly with mild central congestion. No pneumothorax . IMPRESSION: Borderline cardiomegaly with mild central vascular congestion. No edema or infiltrate. Electronically Signed   By: Jasmine Pang M.D.   On: 02/27/2017 00:22   Ct Head Wo Contrast  Result Date: 02/27/2017 CLINICAL DATA:  Larey Seat tonight to the left side. Left-sided headache and pain in the left neck, left shoulder, and left chest. EXAM: CT HEAD WITHOUT CONTRAST CT CERVICAL SPINE WITHOUT CONTRAST TECHNIQUE: Multidetector CT imaging of the head and cervical spine was performed following the standard protocol without intravenous contrast. Multiplanar CT image reconstructions of the cervical spine were also generated. COMPARISON:  09/01/2016 FINDINGS: CT HEAD FINDINGS Brain: No evidence of acute infarction, hemorrhage, hydrocephalus, extra-axial collection or mass lesion/mass effect. Vascular: No hyperdense vessel or unexpected calcification. Skull: Calvarium appears intact. Sinuses/Orbits: Mucosal thickening of the maxillary antra and ethmoid air cells with opacification of some of the ethmoid air cells. No acute air-fluid levels are identified. Mastoid air cells are not opacified. Other: None. CT CERVICAL SPINE FINDINGS Alignment: Straightening of usual cervical lordosis. This may be due to patient positioning but ligamentous injury or muscle spasm could also have this appearance and are not excluded. No anterior subluxation. Normal alignment of the facet joints. Alignment is similar to previous study. C1-2 articulation appears intact. Skull base and vertebrae: No acute fracture. No primary bone lesion or focal pathologic process. Soft tissues and spinal canal: No prevertebral fluid or swelling. No visible canal hematoma. Disc levels: No significant disc disease is indicated. Ligamentous calcification anteriorly at C5-6 and C6-7 levels. Upper chest: No acute abnormalities demonstrated. Other: No  significant change since prior study. IMPRESSION: CT head: No acute intracranial abnormalities. Probable inflammatory changes in the paranasal sinuses. CT cervical spine: Nonspecific straightening of usual cervical lordosis. No acute displaced fractures identified. Electronically Signed   By: Burman Nieves M.D.   On: 02/27/2017 00:16   Ct Cervical Spine Wo Contrast  Result Date: 02/27/2017 CLINICAL DATA:  Larey Seat  tonight to the left side. Left-sided headache and pain in the left neck, left shoulder, and left chest. EXAM: CT HEAD WITHOUT CONTRAST CT CERVICAL SPINE WITHOUT CONTRAST TECHNIQUE: Multidetector CT imaging of the head and cervical spine was performed following the standard protocol without intravenous contrast. Multiplanar CT image reconstructions of the cervical spine were also generated. COMPARISON:  09/01/2016 FINDINGS: CT HEAD FINDINGS Brain: No evidence of acute infarction, hemorrhage, hydrocephalus, extra-axial collection or mass lesion/mass effect. Vascular: No hyperdense vessel or unexpected calcification. Skull: Calvarium appears intact. Sinuses/Orbits: Mucosal thickening of the maxillary antra and ethmoid air cells with opacification of some of the ethmoid air cells. No acute air-fluid levels are identified. Mastoid air cells are not opacified. Other: None. CT CERVICAL SPINE FINDINGS Alignment: Straightening of usual cervical lordosis. This may be due to patient positioning but ligamentous injury or muscle spasm could also have this appearance and are not excluded. No anterior subluxation. Normal alignment of the facet joints. Alignment is similar to previous study. C1-2 articulation appears intact. Skull base and vertebrae: No acute fracture. No primary bone lesion or focal pathologic process. Soft tissues and spinal canal: No prevertebral fluid or swelling. No visible canal hematoma. Disc levels: No significant disc disease is indicated. Ligamentous calcification anteriorly at C5-6 and C6-7  levels. Upper chest: No acute abnormalities demonstrated. Other: No significant change since prior study. IMPRESSION: CT head: No acute intracranial abnormalities. Probable inflammatory changes in the paranasal sinuses. CT cervical spine: Nonspecific straightening of usual cervical lordosis. No acute displaced fractures identified. Electronically Signed   By: Burman Nieves M.D.   On: 02/27/2017 00:16   Dg Hip Unilat W Or Wo Pelvis 2-3 Views Left  Result Date: 02/27/2017 CLINICAL DATA:  Initial evaluation for acute trauma, fall.  The EXAM: DG HIP (WITH OR WITHOUT PELVIS) 2-3V LEFT COMPARISON:  None. FINDINGS: There is no evidence of hip fracture or dislocation. There is no evidence of arthropathy or other focal bone abnormality. IMPRESSION: No acute osseous abnormality about the left hip. Electronically Signed   By: Rise Mu M.D.   On: 02/27/2017 00:21    Procedures Procedures (including critical care time)  Medications Ordered in ED Medications  HYDROcodone-acetaminophen (NORCO/VICODIN) 5-325 MG per tablet 1 tablet (1 tablet Oral Given 02/27/17 0023)     Initial Impression / Assessment and Plan / ED Course  I have reviewed the triage vital signs and the nursing notes.  Pertinent labs & imaging results that were available during my care of the patient were reviewed by me and considered in my medical decision making (see chart for details).  Clinical Course as of Feb 27 50  Mon Feb 26, 2017  2347 I Have a low suspicion for significant abdominal injury. Patient was walking and fell onto her abdomen from a standing position.  [JK]    Clinical Course User Index [JK] Linwood Dibbles, MD   No evidence of serious injury associated with the fall.  Consistent with soft tissue injury/strain.  Explained findings to patient and warning signs that should prompt return to the ED.   Final Clinical Impressions(s) / ED Diagnoses   Final diagnoses:  Fall, initial encounter  Abrasion    Contusion of left hip, initial encounter    New Prescriptions New Prescriptions   NAPROXEN (NAPROSYN) 500 MG TABLET    Take 1 tablet (500 mg total) by mouth 2 (two) times daily.     Linwood Dibbles, MD 02/27/17 [phone]

## 2017-02-26 NOTE — ED Triage Notes (Signed)
Pt has had bronchitis over the last few weeks.  She has been feeling somewhat better so she went out to eat tonight and fell onto her left side and right knee.  Status post abdominal hernia surgery 5-6 months ago.  Pt c/o pain mainly in that area.

## 2017-02-26 NOTE — ED Notes (Signed)
Bed: WA08 Expected date:  Expected time:  Means of arrival:  Comments: 46 yr old syncope/facial abrasions, no LOC

## 2017-02-27 ENCOUNTER — Other Ambulatory Visit (HOSPITAL_COMMUNITY): Payer: Self-pay

## 2017-02-27 ENCOUNTER — Emergency Department (HOSPITAL_COMMUNITY): Payer: Medicaid Other

## 2017-02-27 MED ORDER — NAPROXEN 500 MG PO TABS: 500.0000 mg | ORAL_TABLET | Freq: Two times a day (BID) | ORAL | 0 refills | Status: DC

## 2017-02-27 NOTE — Discharge Instructions (Signed)
Follow up with your dentist as planned, ice to help with the swelling, take the medications as needed for pain

## 2017-02-27 NOTE — ED Notes (Signed)
Patient transported to CT 

## 2017-05-05 ENCOUNTER — Emergency Department (HOSPITAL_COMMUNITY): Payer: Medicaid Other

## 2017-05-05 ENCOUNTER — Inpatient Hospital Stay (HOSPITAL_COMMUNITY)
Admission: EM | Admit: 2017-05-05 | Discharge: 2017-05-09 | DRG: 746 | Disposition: A | Discharge status: Home / Self Care | Payer: Medicaid Other | Attending: Obstetrics and Gynecology | Admitting: Obstetrics and Gynecology

## 2017-05-05 ENCOUNTER — Encounter (HOSPITAL_COMMUNITY): Payer: Self-pay | Admitting: Nurse Practitioner

## 2017-05-05 DIAGNOSIS — A4902 Methicillin resistant Staphylococcus aureus infection, unspecified site: Secondary | ICD-10-CM | POA: Diagnosis present

## 2017-05-05 DIAGNOSIS — N764 Abscess of vulva: Principal | ICD-10-CM | POA: Diagnosis present

## 2017-05-05 DIAGNOSIS — J45909 Unspecified asthma, uncomplicated: Secondary | ICD-10-CM | POA: Diagnosis present

## 2017-05-05 DIAGNOSIS — M549 Dorsalgia, unspecified: Secondary | ICD-10-CM | POA: Diagnosis present

## 2017-05-05 DIAGNOSIS — G8929 Other chronic pain: Secondary | ICD-10-CM | POA: Diagnosis present

## 2017-05-05 DIAGNOSIS — I10 Essential (primary) hypertension: Secondary | ICD-10-CM | POA: Diagnosis present

## 2017-05-05 DIAGNOSIS — Z6841 Body Mass Index (BMI) 40.0 and over, adult: Secondary | ICD-10-CM

## 2017-05-05 DIAGNOSIS — E7800 Pure hypercholesterolemia, unspecified: Secondary | ICD-10-CM | POA: Diagnosis present

## 2017-05-05 DIAGNOSIS — K589 Irritable bowel syndrome without diarrhea: Secondary | ICD-10-CM | POA: Diagnosis present

## 2017-05-05 DIAGNOSIS — Z22322 Carrier or suspected carrier of Methicillin resistant Staphylococcus aureus: Secondary | ICD-10-CM

## 2017-05-05 DIAGNOSIS — E119 Type 2 diabetes mellitus without complications: Secondary | ICD-10-CM | POA: Diagnosis present

## 2017-05-05 DIAGNOSIS — H00013 Hordeolum externum right eye, unspecified eyelid: Secondary | ICD-10-CM | POA: Diagnosis present

## 2017-05-05 DIAGNOSIS — N179 Acute kidney failure, unspecified: Secondary | ICD-10-CM | POA: Diagnosis present

## 2017-05-05 DIAGNOSIS — Z79891 Long term (current) use of opiate analgesic: Secondary | ICD-10-CM | POA: Diagnosis not present

## 2017-05-05 DIAGNOSIS — N750 Cyst of Bartholin's gland: Secondary | ICD-10-CM

## 2017-05-05 LAB — I-STAT CHEM 8, ED
BUN: 10 mg/dL (ref 6–20)
CALCIUM ION: 1.19 mmol/L (ref 1.15–1.40)
CHLORIDE: 98 mmol/L — AB (ref 101–111)
Creatinine, Ser: 0.9 mg/dL (ref 0.44–1.00)
Glucose, Bld: 128 mg/dL — ABNORMAL HIGH (ref 65–99)
HEMATOCRIT: 34 % — AB (ref 36.0–46.0)
Hemoglobin: 11.6 g/dL — ABNORMAL LOW (ref 12.0–15.0)
Potassium: 3.7 mmol/L (ref 3.5–5.1)
SODIUM: 136 mmol/L (ref 135–145)
TCO2: 29 mmol/L (ref 0–100)

## 2017-05-05 LAB — CBC WITH DIFFERENTIAL/PLATELET
Basophils Absolute: 0 10*3/uL (ref 0.0–0.1)
Basophils Relative: 0 %
EOS ABS: 0.1 10*3/uL (ref 0.0–0.7)
Eosinophils Relative: 1 %
HCT: 33.6 % — ABNORMAL LOW (ref 36.0–46.0)
HEMOGLOBIN: 10.6 g/dL — AB (ref 12.0–15.0)
LYMPHS ABS: 2.5 10*3/uL (ref 0.7–4.0)
Lymphocytes Relative: 15 %
MCH: 26.9 pg (ref 26.0–34.0)
MCHC: 31.5 g/dL (ref 30.0–36.0)
MCV: 85.3 fL (ref 78.0–100.0)
MONOS PCT: 5 %
Monocytes Absolute: 0.9 10*3/uL (ref 0.1–1.0)
NEUTROS ABS: 13.4 10*3/uL — AB (ref 1.7–7.7)
NEUTROS PCT: 79 %
Platelets: 352 10*3/uL (ref 150–400)
RBC: 3.94 MIL/uL (ref 3.87–5.11)
RDW: 16 % — ABNORMAL HIGH (ref 11.5–15.5)
WBC: 16.9 10*3/uL — AB (ref 4.0–10.5)

## 2017-05-05 LAB — URINALYSIS, ROUTINE W REFLEX MICROSCOPIC
BILIRUBIN URINE: NEGATIVE
GLUCOSE, UA: NEGATIVE mg/dL
KETONES UR: NEGATIVE mg/dL
NITRITE: NEGATIVE
PH: 6 (ref 5.0–8.0)
PROTEIN: NEGATIVE mg/dL
Specific Gravity, Urine: 1.003 — ABNORMAL LOW (ref 1.005–1.030)

## 2017-05-05 LAB — GLUCOSE, CAPILLARY
Glucose-Capillary: 99 mg/dL (ref 65–99)
Glucose-Capillary: 97 mg/dL (ref 65–99)
Glucose-Capillary: 159 mg/dL — ABNORMAL HIGH (ref 65–99)

## 2017-05-05 LAB — I-STAT BETA HCG BLOOD, ED (MC, WL, AP ONLY)

## 2017-05-05 LAB — I-STAT CG4 LACTIC ACID, ED: Lactic Acid, Venous: 1.74 mmol/L (ref 0.5–1.9)

## 2017-05-05 MED ORDER — OXYCODONE-ACETAMINOPHEN 5-325 MG PO TABS
1.0000 | ORAL_TABLET | ORAL | Status: DC | PRN
  Administered 2017-05-06 – 2017-05-07 (×3): 2 via ORAL
  Filled 2017-05-05 (×3): qty 2

## 2017-05-05 MED ORDER — ALBUTEROL SULFATE (2.5 MG/3ML) 0.083% IN NEBU: 3.0000 mL | INHALATION_SOLUTION | RESPIRATORY_TRACT | Status: DC | PRN

## 2017-05-05 MED ORDER — VANCOMYCIN HCL IN DEXTROSE 1-5 GM/200ML-% IV SOLN
1000.0000 mg | Freq: Once | INTRAVENOUS | Status: AC
  Administered 2017-05-05: 1000 mg via INTRAVENOUS
  Filled 2017-05-05: qty 200

## 2017-05-05 MED ORDER — IOPAMIDOL (ISOVUE-300) INJECTION 61%
100.0000 mL | Freq: Once | INTRAVENOUS | Status: AC | PRN
Start: 2017-05-05 — End: 2017-05-05
  Administered 2017-05-05: 100 mL via INTRAVENOUS

## 2017-05-05 MED ORDER — IOPAMIDOL (ISOVUE-300) INJECTION 61%
INTRAVENOUS | Status: AC
  Filled 2017-05-05: qty 100

## 2017-05-05 MED ORDER — PANTOPRAZOLE SODIUM 40 MG PO TBEC
40.0000 mg | DELAYED_RELEASE_TABLET | Freq: Every day | ORAL | Status: DC
  Administered 2017-05-06 – 2017-05-09 (×3): 40 mg via ORAL
  Filled 2017-05-05 (×3): qty 1

## 2017-05-05 MED ORDER — PIPERACILLIN-TAZOBACTAM 3.375 G IVPB
3.3750 g | Freq: Three times a day (TID) | INTRAVENOUS | Status: DC
  Administered 2017-05-05 – 2017-05-07 (×7): 3.375 g via INTRAVENOUS
  Filled 2017-05-05 (×8): qty 50

## 2017-05-05 MED ORDER — TRAZODONE HCL 50 MG PO TABS
50.0000 mg | ORAL_TABLET | Freq: Every evening | ORAL | Status: DC | PRN
  Filled 2017-05-05: qty 1

## 2017-05-05 MED ORDER — FLUTICASONE PROPIONATE 50 MCG/ACT NA SUSP
1.0000 | Freq: Every day | NASAL | Status: DC
  Administered 2017-05-05 – 2017-05-09 (×5): 1 via NASAL
  Filled 2017-05-05: qty 16

## 2017-05-05 MED ORDER — IBUPROFEN 800 MG PO TABS
800.0000 mg | ORAL_TABLET | Freq: Three times a day (TID) | ORAL | Status: DC | PRN
  Administered 2017-05-05 – 2017-05-08 (×3): 800 mg via ORAL
  Filled 2017-05-05 (×3): qty 1

## 2017-05-05 MED ORDER — GABAPENTIN 400 MG PO CAPS
800.0000 mg | ORAL_CAPSULE | Freq: Three times a day (TID) | ORAL | Status: DC
  Administered 2017-05-05 – 2017-05-08 (×10): 800 mg via ORAL
  Filled 2017-05-05 (×15): qty 2

## 2017-05-05 MED ORDER — CLONAZEPAM 0.5 MG PO TABS
1.0000 mg | ORAL_TABLET | Freq: Two times a day (BID) | ORAL | Status: DC
  Administered 2017-05-05 – 2017-05-09 (×8): 1 mg via ORAL
  Filled 2017-05-05 (×8): qty 2

## 2017-05-05 MED ORDER — VANCOMYCIN HCL IN DEXTROSE 1-5 GM/200ML-% IV SOLN
1000.0000 mg | Freq: Three times a day (TID) | INTRAVENOUS | Status: DC
  Administered 2017-05-06 – 2017-05-07 (×4): 1000 mg via INTRAVENOUS
  Filled 2017-05-05 (×5): qty 200

## 2017-05-05 MED ORDER — SODIUM CHLORIDE 0.9 % IV SOLN
INTRAVENOUS | Status: DC
  Administered 2017-05-05 – 2017-05-06 (×3): via INTRAVENOUS

## 2017-05-05 MED ORDER — METOCLOPRAMIDE HCL 10 MG PO TABS
10.0000 mg | ORAL_TABLET | Freq: Two times a day (BID) | ORAL | Status: DC
  Administered 2017-05-05 – 2017-05-09 (×8): 10 mg via ORAL
  Filled 2017-05-05 (×8): qty 1

## 2017-05-05 MED ORDER — ZOLPIDEM TARTRATE 5 MG PO TABS
5.0000 mg | ORAL_TABLET | Freq: Every evening | ORAL | Status: DC | PRN
  Administered 2017-05-07: 5 mg via ORAL
  Filled 2017-05-05: qty 1

## 2017-05-05 MED ORDER — GABAPENTIN 800 MG PO TABS
800.0000 mg | ORAL_TABLET | Freq: Three times a day (TID) | ORAL | Status: DC
  Filled 2017-05-05 (×3): qty 1

## 2017-05-05 MED ORDER — PIPERACILLIN-TAZOBACTAM 3.375 G IVPB 30 MIN
3.3750 g | Freq: Once | INTRAVENOUS | Status: AC
  Administered 2017-05-05: 3.375 g via INTRAVENOUS
  Filled 2017-05-05: qty 50

## 2017-05-05 MED ORDER — SERTRALINE HCL 100 MG PO TABS
100.0000 mg | ORAL_TABLET | Freq: Every day | ORAL | Status: DC
  Administered 2017-05-05 – 2017-05-09 (×4): 100 mg via ORAL
  Filled 2017-05-05 (×6): qty 1

## 2017-05-05 MED ORDER — TOBRAMYCIN 0.3 % OP SOLN
2.0000 [drp] | Freq: Four times a day (QID) | OPHTHALMIC | Status: DC
  Administered 2017-05-05 – 2017-05-09 (×15): 2 [drp] via OPHTHALMIC
  Filled 2017-05-05: qty 5

## 2017-05-05 MED ORDER — PIPERACILLIN-TAZOBACTAM 3.375 G IVPB 30 MIN
3.3750 g | Freq: Four times a day (QID) | INTRAVENOUS | Status: DC
  Filled 2017-05-05 (×3): qty 50

## 2017-05-05 MED ORDER — FUROSEMIDE 20 MG PO TABS
20.0000 mg | ORAL_TABLET | Freq: Every day | ORAL | Status: DC
  Administered 2017-05-05 – 2017-05-06 (×2): 20 mg via ORAL
  Filled 2017-05-05 (×3): qty 1

## 2017-05-05 MED ORDER — HYDROMORPHONE HCL 1 MG/ML IJ SOLN
1.0000 mg | INTRAMUSCULAR | Status: DC | PRN
  Administered 2017-05-05 – 2017-05-06 (×5): 1 mg via INTRAVENOUS
  Filled 2017-05-05 (×5): qty 1

## 2017-05-05 MED ORDER — LUBIPROSTONE 24 MCG PO CAPS
24.0000 ug | ORAL_CAPSULE | Freq: Two times a day (BID) | ORAL | Status: DC | PRN
  Administered 2017-05-05: 24 ug via ORAL
  Filled 2017-05-05 (×2): qty 1

## 2017-05-05 MED ORDER — LISINOPRIL 10 MG PO TABS
10.0000 mg | ORAL_TABLET | Freq: Every day | ORAL | Status: DC
  Administered 2017-05-05 – 2017-05-06 (×2): 10 mg via ORAL
  Filled 2017-05-05 (×3): qty 1

## 2017-05-05 MED ORDER — MOMETASONE FURO-FORMOTEROL FUM 200-5 MCG/ACT IN AERO
2.0000 | INHALATION_SPRAY | Freq: Two times a day (BID) | RESPIRATORY_TRACT | Status: DC
  Administered 2017-05-05 – 2017-05-09 (×9): 2 via RESPIRATORY_TRACT
  Filled 2017-05-05: qty 8.8

## 2017-05-05 MED ORDER — PRAVASTATIN SODIUM 40 MG PO TABS
40.0000 mg | ORAL_TABLET | Freq: Every day | ORAL | Status: DC
  Administered 2017-05-05 – 2017-05-08 (×4): 40 mg via ORAL
  Filled 2017-05-05 (×5): qty 1

## 2017-05-05 MED ORDER — VANCOMYCIN HCL IN DEXTROSE 750-5 MG/150ML-% IV SOLN
750.0000 mg | Freq: Two times a day (BID) | INTRAVENOUS | Status: DC
  Administered 2017-05-05: 750 mg via INTRAVENOUS
  Filled 2017-05-05: qty 150

## 2017-05-05 MED ORDER — LISINOPRIL-HYDROCHLOROTHIAZIDE 10-12.5 MG PO TABS
1.0000 | ORAL_TABLET | Freq: Every day | ORAL | Status: DC
  Filled 2017-05-05: qty 1

## 2017-05-05 MED ORDER — HYDROCHLOROTHIAZIDE 12.5 MG PO CAPS
12.5000 mg | ORAL_CAPSULE | Freq: Every day | ORAL | Status: DC
  Administered 2017-05-05 – 2017-05-09 (×5): 12.5 mg via ORAL
  Filled 2017-05-05 (×7): qty 1

## 2017-05-05 NOTE — H&P (Signed)
[name] is an 46 y.o. female admitted with left labial abscess. Pt developed a "bump" on her labial @ 3 days ago. Progressively worsen. Was seen yesterday at local health clinic, unable to I & D. See to ER for further evaluation. ER evaluation revealed left labial abscess, elevated WBC with left shift, normal lactic acid and CT without fluid collection or necrotizing tissue. Pt with know DM ( however does not take any meds) and thus felt pt would be better served with hospitalization for IV antibiotics and possible surgeical eval if needed.   Pt also has H/O HTN, controlled with meds, Asthma, PRN MDI , IBS controlled with meds and hypercholesterolemia controlled with meds.  BTL for contraception  Menstrual History: Menarche age: 43 No LMP recorded.    Past Medical History:  Diagnosis Date  . Asthma   . Back pain   . Diabetes mellitus without complication (HCC)   . Hypertension     Past Surgical History:  Procedure Laterality Date  . FINGER SURGERY  2011  . HERNIA REPAIR    . TUBAL LIGATION  2004    Family History  Problem Relation Age of Onset  . Kidney failure Mother   . Heart attack Father     Social History:  reports that she has never smoked. She has never used smokeless tobacco. She reports that she does not drink alcohol or use drugs.  Allergies: No Known Allergies  Prescriptions Prior to Admission  Medication Sig Dispense Refill Last Dose  . ADVAIR DISKUS 250-50 MCG/DOSE AEPB INHALE 1 PUFF BY MOUTH 1 TIME DAILY  3 05/04/2017 at Unknown time  . albuterol (PROVENTIL HFA;VENTOLIN HFA) 108 (90 BASE) MCG/ACT inhaler Inhale 2 puffs into the lungs every 4 (four) hours as needed for wheezing or shortness of breath. 1 Inhaler 0 Past Month at Unknown time  . albuterol (PROVENTIL) (2.5 MG/3ML) 0.083% nebulizer solution Take 3 mLs (2.5 mg total) by nebulization every 4 (four) hours as needed for wheezing. (Patient taking differently: Take 2.5 mg by nebulization every 4  (four) hours as needed for wheezing. SHORTNESS OF BREATH) 75 mL 0 unknown  . AMITIZA 24 MCG capsule Take 24 mcg by mouth 2 (two) times daily as needed for constipation (takes 1 every morning and 2nd dose only if needed).   6 05/04/2017 at Unknown time  . clonazePAM (KLONOPIN) 1 MG tablet clonazepam 1 mg tablet  take 1/4 tablet BY MOUTH TWICE DAILY   05/04/2017 at Unknown time  . fluticasone (FLONASE) 50 MCG/ACT nasal spray Place 2 sprays into both nostrils daily. (Patient taking differently: Place 2 sprays into both nostrils daily as needed for allergies. ) 16 g 2 Completed Course at Unknown time  . fluticasone (FLONASE) 50 MCG/ACT nasal spray 1 spray by Each Nare route daily as needed for allergies   Past Week at Unknown time  . furosemide (LASIX) 20 MG tablet Take 20 mg by mouth daily.  3 05/04/2017 at Unknown time  . gabapentin (NEURONTIN) 800 MG tablet Take 800 mg by mouth 3 (three) times daily.  3 05/04/2017 at Unknown time  . guaiFENesin-dextromethorphan (ROBITUSSIN DM) 100-10 MG/5ML syrup Take 5 mLs by mouth 3 (three) times daily as needed for cough.   Past Week at Unknown time  . ibuprofen (ADVIL,MOTRIN) 800 MG tablet Take 800 mg by mouth every 6 (six) hours as needed. for pain  0 Past Week at Unknown time  . lisinopril-hydrochlorothiazide (PRINZIDE,ZESTORETIC) 10-12.5 MG tablet Take 1 tablet by mouth daily.  6 05/04/2017 at Unknown time  . metoCLOPramide (REGLAN) 10 MG tablet Take 10 mg by mouth 2 (two) times daily.  1 05/04/2017 at Unknown time  . omeprazole (PRILOSEC) 40 MG capsule Take 40 mg by mouth daily.  3 05/04/2017 at Unknown time  . oxyCODONE (ROXICODONE) 15 MG immediate release tablet Take 15 mg by mouth 4 (four) times daily.  0 05/04/2017 at Unknown time  . pravastatin (PRAVACHOL) 40 MG tablet Take 40 mg by mouth at bedtime.  6 05/04/2017 at Unknown time  . sertraline (ZOLOFT) 100 MG tablet Take 100 mg by mouth daily.  3 05/04/2017 at Unknown time  . traZODone (DESYREL) 50 MG tablet Take  50 mg by mouth at bedtime as needed for sleep.    05/04/2017 at Unknown time  . Vitamin D, Ergocalciferol, (DRISDOL) 50000 units CAPS capsule 1 (ONE) CAPSULE CAPSULE WEEKLY ON SUNDAYS  5 Past Week at Unknown time  . cyclobenzaprine (FLEXERIL) 10 MG tablet Take 1 tablet (10 mg total) by mouth 2 (two) times daily as needed for muscle spasms. (Patient not taking: Reported on 09/01/2016) 20 tablet 0 Completed Course at Unknown time  . cyclobenzaprine (FLEXERIL) 10 MG tablet Take 1 tablet (10 mg total) by mouth 2 (two) times daily as needed for muscle spasms. (Patient not taking: Reported on 05/05/2017) 20 tablet 0 Not Taking at Unknown time  . fluticasone (FLONASE) 50 MCG/ACT nasal spray Place 2 sprays into both nostrils daily. (Patient not taking: Reported on 09/01/2016) 16 g 0 Not Taking at Unknown time  . HYDROcodone-acetaminophen (NORCO) 5-325 MG tablet Take 1-2 tablets by mouth every 6 (six) hours as needed. (Patient not taking: Reported on 05/05/2017) 20 tablet 0 Not Taking at Unknown time  . naproxen (NAPROSYN) 500 MG tablet Take 1 tablet (500 mg total) by mouth 2 (two) times daily. (Patient not taking: Reported on 05/05/2017) 30 tablet 0 Not Taking at Unknown time  . oxyCODONE-acetaminophen (PERCOCET/ROXICET) 5-325 MG per tablet Take 1 tablet by mouth every 6 (six) hours as needed for severe pain. (Patient not taking: Reported on 09/01/2016) 10 tablet 0 Completed Course at Unknown time  . predniSONE (DELTASONE) 10 MG tablet Take 5 tablets (50 mg total) by mouth daily. (Patient not taking: Reported on 09/01/2016) 15 tablet 0 Completed Course at Unknown time  . ranitidine (ZANTAC) 150 MG tablet Take 1 tablet (150 mg total) by mouth 2 (two) times daily. (Patient not taking: Reported on 09/01/2016) 60 tablet 0 Completed Course at Unknown time    ROS AS per HPI plus right eye stye  Blood pressure (!) 114/50, pulse 96, temperature 99.5 F (37.5 C), temperature source Oral, resp. rate 20, SpO2 99 %, unknown  if currently breastfeeding. Physical Exam  Lungs clear Heart RRR Abd soft + BS obese non tender GU left labia swollen, erythematous, woody induration, slightly fluctuant, inguinal lymph adenopathy Right eye stye  Results for orders placed or performed during the hospital encounter of 05/05/17 (from the past 24 hour(s))  CBC with Differential/Platelet     Status: Abnormal   Collection Time: 05/05/17  4:30 AM  Result Value Ref Range   WBC 16.9 (H) 4.0 - 10.5 K/uL   RBC 3.94 3.87 - 5.11 MIL/uL   Hemoglobin 10.6 (L) 12.0 - 15.0 g/dL   HCT 16.133.6 (L) 09.636.0 - 04.546.0 %   MCV 85.3 78.0 - 100.0 fL   MCH 26.9 26.0 - 34.0 pg   MCHC 31.5 30.0 - 36.0 g/dL   RDW 40.916.0 (H) 81.111.5 -  15.5 %   Platelets 352 150 - 400 K/uL   Neutrophils Relative % 79 %   Neutro Abs 13.4 (H) 1.7 - 7.7 K/uL   Lymphocytes Relative 15 %   Lymphs Abs 2.5 0.7 - 4.0 K/uL   Monocytes Relative 5 %   Monocytes Absolute 0.9 0.1 - 1.0 K/uL   Eosinophils Relative 1 %   Eosinophils Absolute 0.1 0.0 - 0.7 K/uL   Basophils Relative 0 %   Basophils Absolute 0.0 0.0 - 0.1 K/uL  Urinalysis, Routine w reflex microscopic     Status: Abnormal   Collection Time: 05/05/17  4:34 AM  Result Value Ref Range   Color, Urine STRAW (A) YELLOW   APPearance CLEAR CLEAR   Specific Gravity, Urine 1.003 (L) 1.005 - 1.030   pH 6.0 5.0 - 8.0   Glucose, UA NEGATIVE NEGATIVE mg/dL   Hgb urine dipstick SMALL (A) NEGATIVE   Bilirubin Urine NEGATIVE NEGATIVE   Ketones, ur NEGATIVE NEGATIVE mg/dL   Protein, ur NEGATIVE NEGATIVE mg/dL   Nitrite NEGATIVE NEGATIVE   Leukocytes, UA MODERATE (A) NEGATIVE   RBC / HPF 0-5 0 - 5 RBC/hpf   WBC, UA 0-5 0 - 5 WBC/hpf   Bacteria, UA RARE (A) NONE SEEN   Squamous Epithelial / LPF 0-5 (A) NONE SEEN  I-stat chem 8, ed     Status: Abnormal   Collection Time: 05/05/17  4:40 AM  Result Value Ref Range   Sodium 136 135 - 145 mmol/L   Potassium 3.7 3.5 - 5.1 mmol/L   Chloride 98 (L) 101 - 111 mmol/L   BUN 10 6 - 20  mg/dL   Creatinine, Ser 1.61 0.44 - 1.00 mg/dL   Glucose, Bld 096 (H) 65 - 99 mg/dL   Calcium, Ion 0.45 4.09 - 1.40 mmol/L   TCO2 29 0 - 100 mmol/L   Hemoglobin 11.6 (L) 12.0 - 15.0 g/dL   HCT 81.1 (L) 91.4 - 78.2 %  I-Stat Beta hCG blood, ED ([facility], [facility], [facility] only)     Status: None   Collection Time: 05/05/17  4:41 AM  Result Value Ref Range   I-stat hCG, quantitative <5.0 <5 mIU/mL   Comment 3          I-Stat CG4 Lactic Acid, ED     Status: None   Collection Time: 05/05/17  4:42 AM  Result Value Ref Range   Lactic Acid, Venous 1.74 0.5 - 1.9 mmol/L    Ct Pelvis W Contrast  Result Date: 05/05/2017 CLINICAL DATA:  Vaginal swelling and suprapubic pain. Diabetic patient. EXAM: CT PELVIS WITH CONTRAST TECHNIQUE: Multidetector CT imaging of the pelvis was performed using the standard protocol following the bolus administration of intravenous contrast. CONTRAST:  ISOVUE-300 IOPAMIDOL (ISOVUE-300) INJECTION 61% COMPARISON:  CT 02/07/2017 FINDINGS: Urinary Tract: Urinary bladder is distended without wall thickening. The distal ureters are decompressed. Bowel: Possible mild sigmoid colonic diverticulosis. No acute diverticulitis. Normal appendix. No bowel inflammation. Small bowel extends into broad-based hernia of the anterior abdominal wall, unchanged from prior exam. Vascular/Lymphatic: Prominent bilateral external iliac and inguinal nodes, likely reactive. Reproductive:  Uterus and adnexa are normal. Other: Edematous appearance with soft tissue edema and skin thickening of the left labia/perineum. There is no definite rim enhancing fluid collection. No soft tissue air to suggest necrotizing soft tissue infection. No pelvic free fluid. Musculoskeletal: There are no acute or suspicious osseous abnormalities. IMPRESSION: Inflammatory changes of the left labia with likely phlegmon, no discrete rim enhancing drainable  fluid collection. No soft tissue air to suggest necrotizing infection. Prominent  bilateral external and inguinal lymph nodes are likely reactive. Electronically Signed   By: Rubye Oaks M.D.   On: 05/05/2017 05:30    Assessment/Plan: Left labial abscess DM Asthma HTN Right eye stye Language Barrier Pt will be admitted for IV antibiotics, pain management, monitoring of BS, continue with home meds. Reevaluated after 24-48 hrs to determine response to antibiotics and need for surgery. Interrupter services used for H & P and POC.  Hermina Staggers 05/05/2017, 11:52 AM

## 2017-05-05 NOTE — ED Provider Notes (Addendum)
[facility]-EMERGENCY DEPT Provider Note   CSN: [id number] Arrival date & time: 05/05/17  0221  By signing my name below, I, [name], attest that this documentation has been prepared under the direction and in the presence of Derwood Kaplan, MD. Electronically Signed: [name], ED Scribe. 05/05/17. 4:02 AM.  History   Chief Complaint Chief Complaint  Patient presents with  . Vaginal Pain    HPI [name] is a 46 y.o. female with a PMHx of DM, HTN and asthma, who presents to the Emergency Department complaining of gradually worsening bump to her left labia that started on Wednesday 05/02/17. Pt reports noticing the bump after going in a swimming pool. She notes the bump has become more swollen and painful. Associated sx includes fever (100), chills, nausea, vomiting, and HA. Pt has taken ibuprofen and tylenol with mild relief. She went to a clinic yesterday, was given antibiotics, and was referred to a gynecologist, because the clinic did not have the resources to drain the abscess. The gynecologist was closed so she was told to come to the ED. Pt denies any other sx at this time.   The history is provided by the patient. A language interpreter was used ([place]).    Past Medical History:  Diagnosis Date  . Asthma   . Back pain   . Diabetes mellitus without complication (HCC)   . Hypertension     There are no active problems to display for this patient.   Past Surgical History:  Procedure Laterality Date  . FINGER SURGERY  2011  . HERNIA REPAIR    . TUBAL LIGATION  2004    OB History    Gravida Para Term Preterm AB Living   1             SAB TAB Ectopic Multiple Live Births                   Home Medications    Prior to Admission medications   Medication Sig Start Date End Date Taking? Authorizing Provider  ADVAIR DISKUS 250-50 MCG/DOSE AEPB INHALE 1 PUFF BY MOUTH 2 TIMES DAILY 08/25/16   [provider]  albuterol (PROVENTIL HFA;VENTOLIN  HFA) 108 (90 BASE) MCG/ACT inhaler Inhale 2 puffs into the lungs every 4 (four) hours as needed for wheezing or shortness of breath. 10/06/13   Cathlyn Parsons, NP  albuterol (PROVENTIL) (2.5 MG/3ML) 0.083% nebulizer solution Take 3 mLs (2.5 mg total) by nebulization every 4 (four) hours as needed for wheezing. Patient not taking: Reported on 09/01/2016 09/10/13   Coral Ceo K, PA-C  AMITIZA 24 MCG capsule Take 24 mcg by mouth 2 (two) times daily. 08/25/16   [provider]  ciprofloxacin (CIPRO) 500 MG tablet Take 500 mg by mouth daily with breakfast.    [provider]  cyclobenzaprine (FLEXERIL) 10 MG tablet Take 1 tablet (10 mg total) by mouth 2 (two) times daily as needed for muscle spasms. Patient not taking: Reported on 09/01/2016 05/05/14   Elpidio Anis, PA-C  cyclobenzaprine (FLEXERIL) 10 MG tablet Take 1 tablet (10 mg total) by mouth 2 (two) times daily as needed for muscle spasms. 09/01/16   Elpidio Anis, PA-C  fluticasone (FLONASE) 50 MCG/ACT nasal spray Place 2 sprays into both nostrils daily. Patient not taking: Reported on 09/01/2016 10/06/13   Cathlyn Parsons, NP  fluticasone [facility]) 50 MCG/ACT nasal spray Place 2 sprays into both nostrils daily. Patient not taking: Reported on 09/01/2016 07/21/14  Hess, Robyn M, PA-C  fluticasone [facility]) 50 MCG/ACT nasal spray 1 spray by Each Nare route daily as needed for allergies 07/21/14   [provider]  furosemide (LASIX) 20 MG tablet Take 20 mg by mouth daily. 08/24/16   [provider]  gabapentin (NEURONTIN) 800 MG tablet Take 800 mg by mouth 3 (three) times daily. 08/25/16   [provider]  guaiFENesin-dextromethorphan (ROBITUSSIN DM) 100-10 MG/5ML syrup Take 5 mLs by mouth 3 (three) times daily as needed for cough.    [provider]  HYDROcodone-acetaminophen (NORCO) 5-325 MG tablet Take 1-2 tablets by mouth every 6 (six) hours as needed. 02/07/17   Geoffery Lyons, MD    ibuprofen (ADVIL,MOTRIN) 800 MG tablet Take 800 mg by mouth every 6 (six) hours as needed. for pain 08/01/16   [provider]  lisinopril-hydrochlorothiazide (PRINZIDE,ZESTORETIC) 10-12.5 MG tablet Take 1 tablet by mouth daily. 08/25/16   [provider]  naproxen (NAPROSYN) 500 MG tablet Take 1 tablet (500 mg total) by mouth 2 (two) times daily. 02/27/17   Linwood Dibbles, MD  omeprazole (PRILOSEC) 40 MG capsule Take 40 mg by mouth daily. 08/25/16   [provider]  oxyCODONE (ROXICODONE) 15 MG immediate release tablet Take 15 mg by mouth 4 (four) times daily. 08/24/16   [provider]  oxyCODONE-acetaminophen (PERCOCET/ROXICET) 5-325 MG per tablet Take 1 tablet by mouth every 6 (six) hours as needed for severe pain. Patient not taking: Reported on 09/01/2016 11/10/13   Horton, Mayer Masker, MD  pravastatin (PRAVACHOL) 40 MG tablet Take 40 mg by mouth at bedtime. 08/25/16   [provider]  predniSONE (DELTASONE) 10 MG tablet Take 5 tablets (50 mg total) by mouth daily. Patient not taking: Reported on 09/01/2016 10/06/13   Cathlyn Parsons, NP  ranitidine (ZANTAC) 150 MG tablet Take 1 tablet (150 mg total) by mouth 2 (two) times daily. Patient not taking: Reported on 09/01/2016 10/06/13   Cathlyn Parsons, NP  RESTASIS 0.05 % ophthalmic emulsion INSTILL ONE DROP IN [facility] EYE TWICE DAILY 07/31/16   [provider]  traZODone (DESYREL) 50 MG tablet Take 50-100 mg by mouth at bedtime as needed for sleep.    [provider]  Vitamin D, Ergocalciferol, (DRISDOL) 50000 units CAPS capsule 1 (ONE) CAPSULE CAPSULE WEEKLY 08/25/16   [provider]    Family History Family History  Problem Relation Age of Onset  . Kidney failure Mother   . Heart attack Father     Social History Social History  Substance Use Topics  . Smoking status: Never Smoker  . Smokeless tobacco: Never Used  . Alcohol use No     Allergies   Patient has no  known allergies.   Review of Systems Review of Systems  Constitutional: Positive for chills and fever.  Gastrointestinal: Positive for vomiting.  Genitourinary: Positive for vaginal pain.  Neurological: Positive for headaches.     Physical Exam Updated Vital Signs BP 127/75 (BP Location: Left Arm)   Pulse 97   Temp 98.7 F (37.1 C) (Oral)   Resp 20   SpO2 100%   Physical Exam  Constitutional: She is oriented to person, place, and time. She appears well-developed and well-nourished.  HENT:  Head: Normocephalic.  Eyes: EOM are normal.  Neck: Normal range of motion.  Pulmonary/Chest: Effort normal.  Abdominal: She exhibits no distension.  Genitourinary:  Genitourinary Comments: Female chaperone present. Significant edema and induration over the left vulva. No swelling in the mucosa region.  Musculoskeletal: Normal range of motion.  Neurological: She is alert and oriented to person, place, and time.  Psychiatric: She has a normal mood and affect.  Nursing note and vitals reviewed.    ED Treatments / Results  DIAGNOSTIC STUDIES: Oxygen Saturation is 100% on RA, normal by my interpretation.   COORDINATION OF CARE: 4:02 AM-Discussed next steps with pt. Pt verbalized understanding and is agreeable with the plan.   Labs (all labs ordered are listed, but only abnormal results are displayed) Labs Reviewed  CBC WITH DIFFERENTIAL/PLATELET - Abnormal; Notable for the following:       Result Value   WBC 16.9 (*)    Hemoglobin 10.6 (*)    HCT 33.6 (*)    RDW 16.0 (*)    Neutro Abs 13.4 (*)    All other components within normal limits  URINALYSIS, ROUTINE W REFLEX MICROSCOPIC - Abnormal; Notable for the following:    Color, Urine STRAW (*)    Specific Gravity, Urine 1.003 (*)    Hgb urine dipstick SMALL (*)    Leukocytes, UA MODERATE (*)    Bacteria, UA RARE (*)    Squamous Epithelial / LPF 0-5 (*)    All other components within normal limits  I-STAT CHEM 8, ED -  Abnormal; Notable for the following:    Chloride 98 (*)    Glucose, Bld 128 (*)    Hemoglobin 11.6 (*)    HCT 34.0 (*)    All other components within normal limits  URINE CULTURE  CULTURE, BLOOD (SINGLE)  I-STAT CG4 LACTIC ACID, ED  I-STAT BETA HCG BLOOD, ED ([facility], [facility], [facility] ONLY)    EKG  EKG Interpretation None       Radiology Ct Pelvis W Contrast  Result Date: 05/05/2017 CLINICAL DATA:  Vaginal swelling and suprapubic pain. Diabetic patient. EXAM: CT PELVIS WITH CONTRAST TECHNIQUE: Multidetector CT imaging of the pelvis was performed using the standard protocol following the bolus administration of intravenous contrast. CONTRAST:  100mL ISOVUE-300 IOPAMIDOL (ISOVUE-300) INJECTION 61% COMPARISON:  CT 02/07/2017 FINDINGS: Urinary Tract: Urinary bladder is distended without wall thickening. The distal ureters are decompressed. Bowel: Possible mild sigmoid colonic diverticulosis. No acute diverticulitis. Normal appendix. No bowel inflammation. Small bowel extends into broad-based hernia of the anterior abdominal wall, unchanged from prior exam. Vascular/Lymphatic: Prominent bilateral external iliac and inguinal nodes, likely reactive. Reproductive:  Uterus and adnexa are normal. Other: Edematous appearance with soft tissue edema and skin thickening of the left labia/perineum. There is no definite rim enhancing fluid collection. No soft tissue air to suggest necrotizing soft tissue infection. No pelvic free fluid. Musculoskeletal: There are no acute or suspicious osseous abnormalities. IMPRESSION: Inflammatory changes of the left labia with likely phlegmon, no discrete rim enhancing drainable fluid collection. No soft tissue air to suggest necrotizing infection. Prominent bilateral external and inguinal lymph nodes are likely reactive. Electronically Signed   By: Rubye OaksMelanie  Ehinger M.D.   On: 05/05/2017 05:30    Procedures Procedures (including critical care time)  Medications Ordered in  ED Medications  vancomycin (VANCOCIN) IVPB 1000 mg/200 mL premix (1,000 mg Intravenous New Bag/Given 05/05/17 0502)  iopamidol (ISOVUE-300) 61 % injection (not administered)  piperacillin-tazobactam (ZOSYN) IVPB 3.375 g (3.375 g Intravenous New Bag/Given 05/05/17 0444)  iopamidol (ISOVUE-300) 61 % injection 100 mL (100 mLs Intravenous Contrast Given 05/05/17 0508)     Initial Impression / Assessment and Plan / ED Course  I have reviewed the triage vital signs and the nursing notes.  Pertinent  labs & imaging results that were available during my care of the patient were reviewed by me and considered in my medical decision making (see chart for details).  Clinical Course as of May 05 538  Sat May 05, 2017  0539 Results from the ER workup discussed with the patient face to face and all questions answered to the best of my ability.  Dr. Despina Hidden made aware of the plan, he will direct admit to women's. CT Pelvis W Contrast [AN]    Clinical Course User Index [AN] Derwood Kaplan, MD    Pt comes in with cc of vaginal pain. Interpreter was utilized for the history.  Pt has hx of DM she reports seeing the vaginal lesion 2 days ago, and it has quickly progresses since then. Pt has suprapubic tenderness and erythema, and she has vulvar edema - most likely bartholin's abscess. She doesn't have a simple infection however, and so I spoke with Dr. Despina Hidden at [facility] and he would like the patient to be admitted and get CT if the pain goes higher than vulvo-vaginal area.  Plan discussed. Broad spectrum antibiotics started.  Final Clinical Impressions(s) / ED Diagnoses   Final diagnoses:  Infected cyst of Bartholin's gland duct    New Prescriptions New Prescriptions   No medications on file    I personally performed the services described in this documentation, which was scribed in my presence. The recorded information has been reviewed and is accurate.    Derwood Kaplan, MD 05/05/17 1610     Derwood Kaplan, MD 05/05/17 [phone]

## 2017-05-05 NOTE — ED Notes (Signed)
[facility] called for transfer to [facility]

## 2017-05-05 NOTE — ED Notes (Signed)
[facility] [employer] [facility], [place]. [facility] Ready for patient

## 2017-05-05 NOTE — Progress Notes (Signed)
Left labia very swollen, firm and painful to touch.  Small amount brownish drainage noted from same site.  No odor noted.

## 2017-05-05 NOTE — ED Triage Notes (Signed)
Pt state since she went to the pool 2 days ago, she noticed "a small bump on one side of her labia, today the bump a swollen significantly and ia causing her severe pain and subjective fever and chills." She reports going to her doctor yesterday and receiving abx shot but was told that they could not drain the abscess since they did not have the resources for it.

## 2017-05-05 NOTE — Progress Notes (Addendum)
ANTIBIOTIC CONSULT NOTE - INITIAL   Addendum:  - change zosyn to 3.375G IV q8h to be infused over 4 hours - give vancomcyin 1g IV x 1 now to total load of 1750 mg; change vancomycin to 1g IV q8h; goal trough ~15 mcg/mL - check vancomycin trough at [facility], if treatment continues  AG[place]ley PharmD BCPS 05/05/2017 4:51 PM   Pharmacy Consult for Vancomycin & Zosyn  Indication: labial abscess   No Known Allergies  Patient Measurements: Height: 5\' 5"  (165.1 cm) Weight: 246 lb (111.6 kg) IBW/kg (Calculated) : 57 Adjusted Body Weight: 78.8 kg   Vital Signs: Temp: 99.2 F (37.3 C) (06/23 1155) Temp Source: Oral (06/23 1155) BP: 114/59 (06/23 1155) Pulse Rate: 88 (06/23 1155) Intake/Output from previous day: No intake/output data recorded. Intake/Output from this shift: No intake/output data recorded.  Labs:  Recent Labs  05/05/17 0430 05/05/17 0440  WBC 16.9*  --   HGB 10.6* 11.6*  PLT 352  --   CREATININE  --  0.90   Estimated Creatinine Clearance: 97.2 mL/min (by C-G formula based on SCr of 0.9 mg/dL). No results for input(s): VANCOTROUGH, VANCOPEAK, VANCORANDOM, GENTTROUGH, GENTPEAK, GENTRANDOM, TOBRATROUGH, TOBRAPEAK, TOBRARND, AMIKACINPEAK, AMIKACINTROU, AMIKACIN in the last 72 hours.   Microbiology: No results found for this or any previous visit (from the past 720 hour(s)).  Medical History: Past Medical History:  Diagnosis Date  . Asthma   . Back pain   . Diabetes mellitus without complication (HCC)   . Hypertension     Medications:  Vancomycin 750 mg IV q12h Zosyn 3.375 gm IV q6h   Assessment: Admitted for labial abscess, with increased WBC, afebrile. Blood & urine culture pending.   Goal of Therapy:  Resolution of abscess (negative blood & urine culture) & remain afebrile  Vancomycin trough level 10-15 mcg/ml  Plan:  Vancomycin & Zosyn for empiric therapy, reevaluated after 24 hrs for narrow antibiotic coverage.  Measure antibiotic drug levels at  steady state  MacedoniaGiang T Le 05/05/2017,2:29 PM

## 2017-05-05 NOTE — ED Notes (Signed)
[facility] Patient is to go by private vehicle with IV in place.

## 2017-05-06 ENCOUNTER — Inpatient Hospital Stay (HOSPITAL_COMMUNITY): Payer: Medicaid Other | Admitting: Anesthesiology

## 2017-05-06 ENCOUNTER — Encounter (HOSPITAL_COMMUNITY)
Admission: EM | Disposition: A | Discharge status: Home / Self Care | Payer: Self-pay | Source: Home / Self Care | Attending: Obstetrics and Gynecology

## 2017-05-06 DIAGNOSIS — N764 Abscess of vulva: Secondary | ICD-10-CM

## 2017-05-06 HISTORY — PX: INCISION AND DRAINAGE ABSCESS: SHX5864

## 2017-05-06 LAB — CBC WITH DIFFERENTIAL/PLATELET
BASOS ABS: 0 10*3/uL (ref 0.0–0.1)
BASOS PCT: 0 %
EOS ABS: 0.4 10*3/uL (ref 0.0–0.7)
Eosinophils Relative: 3 %
HEMATOCRIT: 33.6 % — AB (ref 36.0–46.0)
HEMOGLOBIN: 10.5 g/dL — AB (ref 12.0–15.0)
Lymphocytes Relative: 21 %
Lymphs Abs: 2.4 10*3/uL (ref 0.7–4.0)
MCH: 27.1 pg (ref 26.0–34.0)
MCHC: 31.3 g/dL (ref 30.0–36.0)
MCV: 86.6 fL (ref 78.0–100.0)
Monocytes Absolute: 0.7 10*3/uL (ref 0.1–1.0)
Monocytes Relative: 7 %
NEUTROS ABS: 7.8 10*3/uL — AB (ref 1.7–7.7)
NEUTROS PCT: 69 %
Platelets: 364 10*3/uL (ref 150–400)
RBC: 3.88 MIL/uL (ref 3.87–5.11)
RDW: 16.5 % — ABNORMAL HIGH (ref 11.5–15.5)
WBC: 11.3 10*3/uL — ABNORMAL HIGH (ref 4.0–10.5)

## 2017-05-06 LAB — GLUCOSE, CAPILLARY
GLUCOSE-CAPILLARY: 104 mg/dL — AB (ref 65–99)
GLUCOSE-CAPILLARY: 134 mg/dL — AB (ref 65–99)
GLUCOSE-CAPILLARY: 154 mg/dL — AB (ref 65–99)
Glucose-Capillary: 209 mg/dL — ABNORMAL HIGH (ref 65–99)
Glucose-Capillary: 161 mg/dL — ABNORMAL HIGH (ref 65–99)

## 2017-05-06 LAB — URINE CULTURE: Culture: <10000 — AB

## 2017-05-06 SURGERY — INCISION AND DRAINAGE, ABSCESS
Anesthesia: General | Site: Vagina | Laterality: Left

## 2017-05-06 MED ORDER — FENTANYL CITRATE (PF) 100 MCG/2ML IJ SOLN
25.0000 ug | INTRAMUSCULAR | Status: DC | PRN
  Administered 2017-05-06 (×2): 50 ug via INTRAVENOUS

## 2017-05-06 MED ORDER — HYDROMORPHONE HCL 1 MG/ML IJ SOLN
1.0000 mg | INTRAMUSCULAR | Status: DC | PRN
  Administered 2017-05-06: 1 mg via INTRAVENOUS
  Administered 2017-05-06: 2 mg via INTRAVENOUS
  Administered 2017-05-07: 1 mg via INTRAVENOUS
  Administered 2017-05-07: 2 mg via INTRAVENOUS
  Administered 2017-05-07 – 2017-05-08 (×2): 1 mg via INTRAVENOUS
  Administered 2017-05-08: 2 mg via INTRAVENOUS
  Filled 2017-05-06: qty 2
  Filled 2017-05-06 (×3): qty 1
  Filled 2017-05-06 (×3): qty 2

## 2017-05-06 MED ORDER — HYDROMORPHONE HCL 1 MG/ML IJ SOLN
INTRAMUSCULAR | Status: AC
  Filled 2017-05-06: qty 1

## 2017-05-06 MED ORDER — ONDANSETRON HCL 4 MG/2ML IJ SOLN
INTRAMUSCULAR | Status: AC
  Filled 2017-05-06: qty 2

## 2017-05-06 MED ORDER — PROMETHAZINE HCL 25 MG/ML IJ SOLN: 6.2500 mg | INTRAMUSCULAR | Status: DC | PRN

## 2017-05-06 MED ORDER — POTASSIUM CHLORIDE IN NACL 20-0.45 MEQ/L-% IV SOLN
INTRAVENOUS | Status: DC
  Filled 2017-05-06: qty 1000

## 2017-05-06 MED ORDER — ONDANSETRON HCL 4 MG/2ML IJ SOLN
INTRAMUSCULAR | Status: DC | PRN
  Administered 2017-05-06: 4 mg via INTRAVENOUS

## 2017-05-06 MED ORDER — FENTANYL CITRATE (PF) 100 MCG/2ML IJ SOLN
INTRAMUSCULAR | Status: AC
  Filled 2017-05-06: qty 2

## 2017-05-06 MED ORDER — MIDAZOLAM HCL 5 MG/5ML IJ SOLN
INTRAMUSCULAR | Status: DC | PRN
  Administered 2017-05-06: 2 mg via INTRAVENOUS

## 2017-05-06 MED ORDER — DEXAMETHASONE SODIUM PHOSPHATE 10 MG/ML IJ SOLN
INTRAMUSCULAR | Status: DC | PRN
  Administered 2017-05-06: 4 mg via INTRAVENOUS

## 2017-05-06 MED ORDER — DEXAMETHASONE SODIUM PHOSPHATE 10 MG/ML IJ SOLN
INTRAMUSCULAR | Status: AC
  Filled 2017-05-06: qty 1

## 2017-05-06 MED ORDER — LIDOCAINE HCL (CARDIAC) 20 MG/ML IV SOLN
INTRAVENOUS | Status: AC
  Filled 2017-05-06: qty 5

## 2017-05-06 MED ORDER — HYDROMORPHONE HCL 1 MG/ML IJ SOLN
INTRAMUSCULAR | Status: DC | PRN
  Administered 2017-05-06 (×2): 0.5 mg via INTRAVENOUS

## 2017-05-06 MED ORDER — SODIUM CHLORIDE 0.45 % IV SOLN
INTRAVENOUS | Status: DC
  Administered 2017-05-06: 17:00:00 via INTRAVENOUS
  Filled 2017-05-06 (×3): qty 1000

## 2017-05-06 MED ORDER — MIDAZOLAM HCL 2 MG/2ML IJ SOLN
INTRAMUSCULAR | Status: AC
Start: 2017-05-06 — End: ?
  Filled 2017-05-06: qty 2

## 2017-05-06 MED ORDER — LIDOCAINE HCL (CARDIAC) 20 MG/ML IV SOLN
INTRAVENOUS | Status: DC | PRN
  Administered 2017-05-06: 100 mg via INTRAVENOUS

## 2017-05-06 MED ORDER — ENOXAPARIN SODIUM 40 MG/0.4ML ~~LOC~~ SOLN
40.0000 mg | SUBCUTANEOUS | Status: DC
  Administered 2017-05-07 – 2017-05-09 (×3): 40 mg via SUBCUTANEOUS
  Filled 2017-05-06 (×4): qty 0.4

## 2017-05-06 MED ORDER — PROPOFOL 10 MG/ML IV BOLUS
INTRAVENOUS | Status: AC
  Filled 2017-05-06: qty 20

## 2017-05-06 MED ORDER — PROPOFOL 10 MG/ML IV BOLUS
INTRAVENOUS | Status: DC | PRN
  Administered 2017-05-06: 200 mg via INTRAVENOUS

## 2017-05-06 MED ORDER — FENTANYL CITRATE (PF) 100 MCG/2ML IJ SOLN
INTRAMUSCULAR | Status: DC | PRN
  Administered 2017-05-06 (×2): 50 ug via INTRAVENOUS

## 2017-05-06 MED ORDER — VITAMIN D (ERGOCALCIFEROL) 1.25 MG (50000 UNIT) PO CAPS
50000.0000 [IU] | ORAL_CAPSULE | ORAL | Status: DC
  Administered 2017-05-06: 50000 [IU] via ORAL
  Filled 2017-05-06: qty 1

## 2017-05-06 SURGICAL SUPPLY — 21 items
BLADE SURG 11 STRL SS (BLADE) ×3 IMPLANT
ELECT REM PT RETURN 9FT ADLT (ELECTROSURGICAL) ×3
ELECTRODE REM PT RTRN 9FT ADLT (ELECTROSURGICAL) ×1 IMPLANT
GAUZE SPONGE 4X4 16PLY XRAY LF (GAUZE/BANDAGES/DRESSINGS) ×3 IMPLANT
GLOVE BIO SURGEON STRL SZ8 (GLOVE) ×3 IMPLANT
GLOVE BIOGEL PI IND STRL 6.5 (GLOVE) ×1 IMPLANT
GLOVE BIOGEL PI IND STRL 7.0 (GLOVE) ×1 IMPLANT
GLOVE BIOGEL PI IND STRL 8 (GLOVE) ×1 IMPLANT
GLOVE BIOGEL PI INDICATOR 6.5 (GLOVE) ×2
GLOVE BIOGEL PI INDICATOR 7.0 (GLOVE) ×2
GLOVE BIOGEL PI INDICATOR 8 (GLOVE) ×2
GLOVE SS BIOGEL STRL SZ 6.5 (GLOVE) ×1 IMPLANT
GLOVE SUPERSENSE BIOGEL SZ 6.5 (GLOVE) ×2
GOWN SRG LRG LVL 3 NONREINFORC (GOWNS) ×2 IMPLANT
GOWN STRL NON-REIN LRG LVL3 (GOWNS) ×4
IV SOD CHL 0.9% 1000ML (IV SOLUTION) ×3 IMPLANT
PACK VAGINAL MINOR WOMEN LF (CUSTOM PROCEDURE TRAY) ×3 IMPLANT
PENCIL BUTTON BLDE SNGL 10FT (ELECTRODE) ×3 IMPLANT
PENCIL BUTTON HOLSTER BLD 10FT (ELECTRODE) ×3 IMPLANT
SUT SILK 2 0 FS (SUTURE) ×6 IMPLANT
TOWEL OR 17X24 6PK STRL BLUE (TOWEL DISPOSABLE) ×6 IMPLANT

## 2017-05-06 NOTE — Anesthesia Procedure Notes (Signed)
Procedure Name: LMA Insertion Date/Time: 05/06/2017 10:23 AM Performed by: Fanny DanceMULLINS, Makira Holleman L Pre-anesthesia Checklist: Patient identified, Patient being monitored, Emergency Drugs available, Timeout performed and Suction available Patient Re-evaluated:Patient Re-evaluated prior to inductionOxygen Delivery Method: Circle System Utilized Preoxygenation: Pre-oxygenation with 100% oxygen Intubation Type: IV induction Ventilation: Mask ventilation without difficulty LMA: LMA inserted LMA Size: 4.0 Number of attempts: 1 Placement Confirmation: positive ETCO2 and breath sounds checked- equal and bilateral Dental Injury: Teeth and Oropharynx as per pre-operative assessment

## 2017-05-06 NOTE — Transfer of Care (Signed)
Immediate Anesthesia Transfer of Care Note  Patient: [name]  Procedure(s) Performed: Procedure(s): INCISION AND DRAINAGE ABSCESS (Left)  Patient Location: PACU  Anesthesia Type:General  Level of Consciousness: awake  Airway & Oxygen Therapy: Patient Spontanous Breathing and Patient connected to nasal cannula oxygen  Post-op Assessment: Report given to RN and Post -op Vital signs reviewed and stable  Post vital signs: stable  Last Vitals:  Vitals:   05/06/17 0418 05/06/17 0824  BP: (!) 108/48 (!) 120/43  Pulse: 74 76  Resp: 17 16  Temp: 37.1 C 37.2 C    Last Pain:  Vitals:   05/06/17 0824  TempSrc: Oral  PainSc:       Patients Stated Pain Goal: 3 (05/06/17 0819)  Complications: No apparent anesthesia complications

## 2017-05-06 NOTE — Anesthesia Postprocedure Evaluation (Signed)
Anesthesia Post Note  Patient: [name]  Procedure(s) Performed: Procedure(s) (LRB): INCISION AND DRAINAGE ABSCESS (Left)     Patient location during evaluation: PACU Anesthesia Type: General Level of consciousness: awake and alert Pain management: pain level controlled Vital Signs Assessment: post-procedure vital signs reviewed and stable Respiratory status: spontaneous breathing, nonlabored ventilation, respiratory function stable and patient connected to nasal cannula oxygen Cardiovascular status: blood pressure returned to baseline and stable Postop Assessment: no signs of nausea or vomiting Anesthetic complications: no    Last Vitals:  Vitals:   05/06/17 1557 05/06/17 1950  BP: (!) 100/50 (!) 107/46  Pulse: 80 90  Resp: 16 18  Temp: 36.9 C 37.3 C    Last Pain:  Vitals:   05/06/17 2023  TempSrc:   PainSc: 8    Pain Goal: Patients Stated Pain Goal: 3 (05/06/17 0819)               Cecile HearingStephen Edward Turk

## 2017-05-06 NOTE — Anesthesia Preprocedure Evaluation (Signed)
Anesthesia Evaluation  Patient identified by MRN, date of birth, ID band Patient awake    Reviewed: Allergy & Precautions, NPO status , Patient's Chart, lab work & pertinent test results  Airway Mallampati: II  TM Distance: >3 FB Neck ROM: Full    Dental  (+) Teeth Intact, Dental Advisory Given   Pulmonary asthma ,    Pulmonary exam normal breath sounds clear to auscultation       Cardiovascular hypertension, Pt. on medications Normal cardiovascular exam Rhythm:Regular Rate:Normal     Neuro/Psych negative neurological ROS  negative psych ROS   GI/Hepatic Neg liver ROS, GERD  Medicated,  Endo/Other  diabetes, Type obesity  Renal/GU negative Renal ROS     Musculoskeletal negative musculoskeletal ROS (+)   Abdominal   Peds  Hematology  (+) Blood dyscrasia, anemia ,   Anesthesia Other Findings Day of surgery medications reviewed with the patient.  Reproductive/Obstetrics                             Anesthesia Physical Anesthesia Plan  ASA: III  Anesthesia Plan: General   Post-op Pain Management:    Induction: Intravenous  PONV Risk Score and Plan: 4 or greater and Ondansetron, Dexamethasone, Propofol, Midazolam and Scopolamine patch - Pre-op  Airway Management Planned: LMA  Additional Equipment:   Intra-op Plan:   Post-operative Plan: Extubation in OR  Informed Consent: I have reviewed the patients History and Physical, chart, labs and discussed the procedure including the risks, benefits and alternatives for the proposed anesthesia with the patient or authorized representative who has indicated his/her understanding and acceptance.   Dental advisory given  Plan Discussed with: CRNA  Anesthesia Plan Comments: (Risks/benefits of general anesthesia discussed with patient including risk of damage to teeth, lips, gum, and tongue, nausea/vomiting, allergic reactions to  medications, and the possibility of heart attack, stroke and death.  All patient questions answered.  Patient wishes to proceed.)        Anesthesia Quick Evaluation

## 2017-05-06 NOTE — Op Note (Signed)
Preoperative diagnosis:  Left vulvar abscess                                         Unmanaged diabetes    Postoperative diagnosis: Same as above  Procedure: Incision and drainage of left vulvar abscess with packing  Surgeon:Carrson Lightcap H, MD  Anesthesia: Laryngeal mask airway  Findings: Patient was admitted to the hospital just over 24 hours ago with a left vulvar abscess which had failed outpatient management.  She presented to the [facility] ED and I was contacted regarding her care.  She is an unmanaged diabetic.  She was started on vancomycin and Zosyn and my plan was to continue this for approximately 24 hours or so to try to cool down the process and then proceed with surgical management.  In the OR today the patient had a extremely indurated left vulva with abscess formation, not completely confluence there were pockets of abscess formation.  Most specifically cephalad on the left, I did manual dissection to undermine the tissue and liberate the abscess pocket which was really in the mons pubis.  So the affected tissue and abscess extends all the way up into the mons and down to the level of the introitus.  Description of operation: Patient was taken to the operating room and underwent laryngeal mask airway anesthesia She was placed in low lithotomy position She was prepped and draped in the usual sterile fashion An #11 blade was used and a large incision pretty much from the level of the clitoris all the way down to the introitus was made and purulent material was drained and there was a large amount of indurated fat As in most cases with vulvar abscesses manual exploration revealed an affected area up onto the mons pubis well above the area of the incision This was entered manually and opened up The indurated necrotic fat that was present was cut back and I got all the tissue down to a healthy bleeding bed of non-indurated fat I try to minimize the amount of electrocautery that I  used but would prefer to do that as opposed to putting in sutures to control bleeding Also help pressure for approximately 5 minutes with technic care soaked scrub sponges in the wound space which led to good hemostasis The scrub sponges were removed The entire length of the surgical defect including the manually undermined tissue on the mons pubis was packed using Curlex soaked in technic care There was one roll of Curlex used to pack in the wound The Curlex was secured with 3 interrupted silk sutures that were placed very loosely again just to hold the Curlex in place and to make a postoperative care much easier The patient was awakened from anesthesia and taken recovery in good stable condition Her vancomycin and Zosyn doses were essentially being hung as scheduled as we went back to the OR Aerobic and anaerobic cultures were obtained at the beginning of the procedure is seen as opened up spaces  Until cultures return and we will keep the patient on vancomycin and Zosyn  I would recommend removing the sutures and the packing in approximately 48 hours or the morning of 05/08/2017 Most probably  will require repacking at that time but can be packed much more loosely at that point It may be a good idea to get the wound care team involved at that point for ongoing care  as this will probably take 4-6 weeks to heal completely  Estimated blood loss for the procedure was 100 cc  Lazaro ArmsEURE,Jazzlene Huot H, MD 05/06/2017 11:31 AM

## 2017-05-06 NOTE — Progress Notes (Signed)
Patient ID: [name], female   DOB: 02/03/1971, 46 y.o.   MRN: [id number]   HD # 1  Pt reports feeling some better this morning. Pain has decreased both vaginally and right eye. But still requiring pain medication. Had spontaneous  rupture of abscess last evening. Still draining a little this morning.  Tolerating diet and up to restroom without any problems.  PE AF VSS BS 99,97,159 & 104  Lungs clear Heart RRR Abd soft + BS obese GU decreased left labial swelling, small opening noted in center, able to express purulent material but pt unable to tolerate  A/P Left Labial Abscess        Right eye stye        HTN        DM        IBS  WBC decreasing, afebrile. But feel I & D in OR would be best for pt. Reviewed with pt, R/B.  Interrupter used. Pt verbalized understanding and agrees to procedure. Consent obtained. Nursing and anesthesia notified. Will proceed to OR when GYN team arrives. Continue with antibiotics.

## 2017-05-07 ENCOUNTER — Encounter (HOSPITAL_COMMUNITY): Payer: Self-pay | Admitting: Obstetrics & Gynecology

## 2017-05-07 DIAGNOSIS — N179 Acute kidney failure, unspecified: Secondary | ICD-10-CM | POA: Diagnosis not present

## 2017-05-07 LAB — CBC
HCT: 28.4 % — ABNORMAL LOW (ref 36.0–46.0)
Hemoglobin: 9.1 g/dL — ABNORMAL LOW (ref 12.0–15.0)
MCH: 27.4 pg (ref 26.0–34.0)
MCHC: 32 g/dL (ref 30.0–36.0)
MCV: 85.5 fL (ref 78.0–100.0)
PLATELETS: 363 10*3/uL (ref 150–400)
RBC: 3.32 MIL/uL — ABNORMAL LOW (ref 3.87–5.11)
RDW: 16.2 % — ABNORMAL HIGH (ref 11.5–15.5)
WBC: 14.1 10*3/uL — ABNORMAL HIGH (ref 4.0–10.5)

## 2017-05-07 LAB — GLUCOSE, CAPILLARY
GLUCOSE-CAPILLARY: 115 mg/dL — AB (ref 65–99)
GLUCOSE-CAPILLARY: 130 mg/dL — AB (ref 65–99)
Glucose-Capillary: 131 mg/dL — ABNORMAL HIGH (ref 65–99)
Glucose-Capillary: 129 mg/dL — ABNORMAL HIGH (ref 65–99)
Glucose-Capillary: 107 mg/dL — ABNORMAL HIGH (ref 65–99)
Glucose-Capillary: 153 mg/dL — ABNORMAL HIGH (ref 65–99)
Glucose-Capillary: 124 mg/dL — ABNORMAL HIGH (ref 65–99)
Glucose-Capillary: 96 mg/dL (ref 65–99)

## 2017-05-07 LAB — BASIC METABOLIC PANEL
Anion gap: 8 (ref 5–15)
BUN: 15 mg/dL (ref 6–20)
CHLORIDE: 103 mmol/L (ref 101–111)
CO2: 24 mmol/L (ref 22–32)
CREATININE: 1.14 mg/dL — AB (ref 0.44–1.00)
Calcium: 8.7 mg/dL — ABNORMAL LOW (ref 8.9–10.3)
GFR calc Af Amer: >60 mL/min (ref >60)
GFR, EST NON AFRICAN AMERICAN: 57 mL/min — AB (ref >60)
Glucose, Bld: 126 mg/dL — ABNORMAL HIGH (ref 65–99)
Potassium: 3.9 mmol/L (ref 3.5–5.1)
SODIUM: 135 mmol/L (ref 135–145)

## 2017-05-07 LAB — HEMOGLOBIN A1C
HEMOGLOBIN A1C: 6 % — AB (ref 4.8–5.6)
MEAN PLASMA GLUCOSE: 126 mg/dL

## 2017-05-07 LAB — VANCOMYCIN, TROUGH: Vancomycin Tr: 26 ug/mL (ref 15–20)

## 2017-05-07 MED ORDER — ZINC OXIDE 40 % EX OINT: TOPICAL_OINTMENT | Freq: Two times a day (BID) | CUTANEOUS | Status: DC

## 2017-05-07 MED ORDER — OXYCODONE HCL 5 MG PO TABS
15.0000 mg | ORAL_TABLET | Freq: Four times a day (QID) | ORAL | Status: DC | PRN
  Administered 2017-05-07 – 2017-05-08 (×2): 15 mg via ORAL
  Filled 2017-05-07 (×2): qty 3

## 2017-05-07 MED ORDER — DOXYCYCLINE HYCLATE 100 MG PO TABS
100.0000 mg | ORAL_TABLET | Freq: Two times a day (BID) | ORAL | Status: DC
  Administered 2017-05-07 – 2017-05-09 (×5): 100 mg via ORAL
  Filled 2017-05-07 (×7): qty 1

## 2017-05-07 MED ORDER — ACETAMINOPHEN 325 MG PO TABS: 650.0000 mg | ORAL_TABLET | Freq: Four times a day (QID) | ORAL | Status: DC | PRN

## 2017-05-07 MED ORDER — VANCOMYCIN HCL IN DEXTROSE 750-5 MG/150ML-% IV SOLN
750.0000 mg | Freq: Two times a day (BID) | INTRAVENOUS | Status: DC
  Administered 2017-05-07: 750 mg via INTRAVENOUS
  Filled 2017-05-07 (×2): qty 150

## 2017-05-07 MED ORDER — ZINC OXIDE 20 % EX OINT
TOPICAL_OINTMENT | Freq: Two times a day (BID) | CUTANEOUS | Status: DC
  Administered 2017-05-07 – 2017-05-09 (×4): via TOPICAL
  Filled 2017-05-07: qty 56.7

## 2017-05-07 NOTE — Progress Notes (Signed)
CRITICAL VALUE ALERT  Critical Value:  Vancomycin trough = 26  Date & Time Notied: 05/07/17 0250  Provider Notified: Dr Despina HiddenEure   Orders Received/Actions taken: Smiley Housemaniffany Midkiff (pharmacist) notified via phone

## 2017-05-07 NOTE — Progress Notes (Signed)
GYN Progress Note  Patient sleeping comfortably. Will come back later and exam; rn to page when she's awake [place] controlled substances database checked and she has been getting oxycodone 15mg  120tabs for each month for several months. Pain regimen adjusted  Depending on how patient tolerates exam today, may do dressing change at bedside or in the OR tomorrow.   [name], Jr MD Attending [facility] 05/07/2017 Time: [phone]

## 2017-05-07 NOTE — Procedures (Signed)
Patient placed in frog legged position after 2mg  of dilaudid IV was given. Left labial packing in place with three transverse silk sutures loosely tied to keep the packing in place. No inguinal LAD b/l and mons is slightly ttp but no overlying skin changes and same goes for the left labia. Packing appeared clean and somewhat dry. The three silk sutures were cut and the kerlix packing removed; it appeared that a whole packing had been tightly placed; the three silk suture remnants were then removed. The defect was then examined: Healthy appearing granulation tissue was noted with prior areas that were cauterized noted. The area was probed, which was limited due to patient discomfort. It was felt to extend to the mons and to the contra lateral area about 5cm and the left labial defect is about 4cm across (without stretching out the skin) x 4cm deep and 7-8cm in length.  The defect was then copiously irrigated with 100mL of sterile saline. Kerlix was then moistened and wrung out and packed as best as possible into the defect and into the corners. The creases were then dryed and an ABD pad placed over the area.  Area appears to be healing well. Will d/c vanc and zosyn and start doxy 100 bid. Destin cream to left creases. Will continue with qday wet to dry changes. If still looks good tomorrow, will get the process for home health to do changes.   Cornelia Copaharlie Meara Wiechman, Jr MD Attending [facility] 05/07/2017 Time: [phone]

## 2017-05-07 NOTE — Progress Notes (Signed)
error 

## 2017-05-07 NOTE — Progress Notes (Addendum)
Pharmacy Antibiotic Note Addendum: SCr today is 1.14, CrCl=76 ml/min. Continue recommended dose change of vancomycin 750mg  Q12 hours Smiley Housemaniffany Caileb Rhue, PharmD   HPI: [name] is a 46 y.o. female admitted on 05/05/2017 with labial abscess.  Pharmacy has been consulted for vancomycin dosing.  Pt received loading dose followed by maintenance dose of 1000mg  Q8 hours.  Vancomycin trough drawn at 2329 on 6/24 = 26 ug/mL  Goal trough is 15-20.   Plan: - Adjust vancomycin dose to 750mg  IV Q12 hours - Check SCr, ordered for 0500 6/25 - follow-up vancomycin level at steady state, if continued   Height: 5\' 5"  (165.1 cm) Weight: 246 lb (111.6 kg) IBW/kg (Calculated) : 57  Temp (24hrs), Avg:98.7 F (37.1 C), Min:97.8 F (36.6 C), Max:100 F (37.8 C)   Recent Labs Lab 05/05/17 0430 05/05/17 0440 05/05/17 0442 05/06/17 0509 05/06/17 2329  WBC 16.9*  --   --  11.3*  --   CREATININE  --  0.90  --   --   --   LATICACIDVEN  --   --  1.74  --   --   VANCOTROUGH  --   --   --   --  26*    Estimated Creatinine Clearance: 97.2 mL/min (by C-G formula based on SCr of 0.9 mg/dL).    No Known Allergies  Antimicrobials this admission: Vancomycin  6/23 >>  Zosyn  6/23 >>   Dose adjustments this admission: vanc 1 gram Q8 > vanc 750mg  Q12  Microbiology results: 6/23 BCx: no growth to date 6/23 UCx: <10,000 colonies, insignificant growth  6/23 Wound: pending    Thank you for allowing pharmacy to be a part of this patient's care.  Loyola MastMidkiff, Deona Novitski Scarlett 05/07/2017 3:00 AM

## 2017-05-07 NOTE — Progress Notes (Signed)
1 Day Post-Op Procedure(s) (LRB): INCISION AND DRAINAGE ABSCESS (Left)  Subjective: Patient reports incisional pain, tolerating PO and no problems voiding.    Objective: I have reviewed patient's vital signs, intake and output, medications, labs and microbiology.  General: alert, cooperative and mild distress left vulva dressing is dry with some discharge, sutures are in place  Assessment: s/p Procedure(s): INCISION AND DRAINAGE ABSCESS (Left): stable  Plan: Continue with vancomycin and zosyn Cultures from OR yesterday are negative, transition to oral tomorrow whne packing is changed  Recommend removal of sutures and packing in am with tissue irrigation and probable repacking Hopefully will be able to get home health or wound care involved to transition to outpatient management approach later this week  LOS: 2 days    [name],[name] 05/07/2017, 9:00 AM

## 2017-05-08 ENCOUNTER — Encounter (HOSPITAL_COMMUNITY): Payer: Self-pay | Admitting: *Deleted

## 2017-05-08 DIAGNOSIS — N764 Abscess of vulva: Principal | ICD-10-CM

## 2017-05-08 LAB — CBC WITH DIFFERENTIAL/PLATELET
Basophils Absolute: 0 10*3/uL (ref 0.0–0.1)
Basophils Relative: 0 %
EOS ABS: 0.1 10*3/uL (ref 0.0–0.7)
Eosinophils Relative: 1 %
HEMATOCRIT: 26.3 % — AB (ref 36.0–46.0)
HEMOGLOBIN: 8.5 g/dL — AB (ref 12.0–15.0)
LYMPHS ABS: 3.7 10*3/uL (ref 0.7–4.0)
LYMPHS PCT: 37 %
MCH: 27.9 pg (ref 26.0–34.0)
MCHC: 32.3 g/dL (ref 30.0–36.0)
MCV: 86.2 fL (ref 78.0–100.0)
Monocytes Absolute: 0.5 10*3/uL (ref 0.1–1.0)
Monocytes Relative: 5 %
NEUTROS ABS: 5.6 10*3/uL (ref 1.7–7.7)
NEUTROS PCT: 57 %
Platelets: 333 10*3/uL (ref 150–400)
RBC: 3.05 MIL/uL — AB (ref 3.87–5.11)
RDW: 16.4 % — ABNORMAL HIGH (ref 11.5–15.5)
WBC: 10 10*3/uL (ref 4.0–10.5)

## 2017-05-08 LAB — BASIC METABOLIC PANEL
Anion gap: 5 (ref 5–15)
BUN: 17 mg/dL (ref 6–20)
CHLORIDE: 104 mmol/L (ref 101–111)
CO2: 28 mmol/L (ref 22–32)
Calcium: 8.8 mg/dL — ABNORMAL LOW (ref 8.9–10.3)
Creatinine, Ser: 1.17 mg/dL — ABNORMAL HIGH (ref 0.44–1.00)
GFR calc Af Amer: >60 mL/min (ref >60)
GFR calc non Af Amer: 55 mL/min — ABNORMAL LOW (ref >60)
Glucose, Bld: 103 mg/dL — ABNORMAL HIGH (ref 65–99)
POTASSIUM: 3.8 mmol/L (ref 3.5–5.1)
SODIUM: 137 mmol/L (ref 135–145)

## 2017-05-08 LAB — TYPE AND SCREEN
ABO/RH(D): A POS
Antibody Screen: NEGATIVE

## 2017-05-08 LAB — GLUCOSE, CAPILLARY
GLUCOSE-CAPILLARY: 94 mg/dL (ref 65–99)
GLUCOSE-CAPILLARY: 103 mg/dL — AB (ref 65–99)
GLUCOSE-CAPILLARY: 88 mg/dL (ref 65–99)
GLUCOSE-CAPILLARY: 115 mg/dL — AB (ref 65–99)
Glucose-Capillary: 92 mg/dL (ref 65–99)
Glucose-Capillary: 148 mg/dL — ABNORMAL HIGH (ref 65–99)

## 2017-05-08 LAB — ABO/RH: ABO/RH(D): A POS

## 2017-05-08 MED ORDER — HYDROMORPHONE HCL 2 MG PO TABS
2.0000 mg | ORAL_TABLET | Freq: Four times a day (QID) | ORAL | Status: DC | PRN
  Administered 2017-05-08: 2 mg via ORAL
  Filled 2017-05-08: qty 1

## 2017-05-08 MED ORDER — IBUPROFEN 800 MG PO TABS
800.0000 mg | ORAL_TABLET | Freq: Three times a day (TID) | ORAL | Status: DC
  Administered 2017-05-08 – 2017-05-09 (×2): 800 mg via ORAL
  Filled 2017-05-08 (×2): qty 1

## 2017-05-08 MED ORDER — HYDROMORPHONE HCL 2 MG/ML IJ SOLN
1.0000 mg | INTRAMUSCULAR | Status: DC | PRN
  Administered 2017-05-08: 2 mg via INTRAVENOUS
  Administered 2017-05-08 – 2017-05-09 (×3): 1 mg via INTRAVENOUS
  Filled 2017-05-08 (×4): qty 1

## 2017-05-08 MED ORDER — OXYCODONE HCL 5 MG PO TABS
20.0000 mg | ORAL_TABLET | ORAL | Status: DC | PRN
  Administered 2017-05-08: 20 mg via ORAL
  Filled 2017-05-08: qty 4

## 2017-05-08 MED ORDER — ACETAMINOPHEN 500 MG PO TABS
1000.0000 mg | ORAL_TABLET | Freq: Three times a day (TID) | ORAL | Status: DC
  Administered 2017-05-08 – 2017-05-09 (×3): 1000 mg via ORAL
  Filled 2017-05-08 (×3): qty 2

## 2017-05-08 NOTE — Procedures (Signed)
Patient placed in frog legged position after 2mg  of dilaudid IV was given. Left labial packing removed. No inguinal LAD b/l and mons is moderately ttp but no overlying skin changes. No skin changes to left labia. The defect was then examined: Healthy appearing granulation tissue was noted with prior areas that were cauterized noted. The area was probed, which was limited due to patient discomfort. It was felt to extend to the mons and to the contra lateral area about 5cm and the left labial defect is about 4cm across (without stretching out the skin) x 4cm deep and 7-8cm in length.  The defect was then copiously irrigated with 100mL of sterile saline. Packing was then moistened and wrung out and packed as best as possible into the defect and into the corners. The creases were then dryed and an ABD pad placed over the area.  Area appears to be healing well. Will d/c vanc and zosyn and start doxy 100 bid. Destin cream to left creases. Will continue with qday wet to dry changes. Will get the process for home health to do changes.   [name], Sophia J, DO 05/08/2017 5:03 PM

## 2017-05-08 NOTE — Progress Notes (Signed)
Pt called out that she had a bowel movement and contaminated the labial packing and also has pulled out the packing partially. Assessed incision, small amout of stool on approx 1 inch of the dressing. On my initial shift assessment appx 1 inch of packing was left outside of wound, at this assessment 6-7 inches have been pulled out. MD Pickens called with report. Orders to cut off the area that has been soiled with stool. If the packing comes out completely during this procedure call MD back and he will come up to assess pt. Was able to cut off the contaminated area without pulling the packing out completely. Packing was trimmed and then left in place. Pain meds given to pt prior to procedure.

## 2017-05-08 NOTE — Progress Notes (Signed)
Daily Gynecology Note  Admission Date: 05/05/2017 Current Date: 05/08/2017 7:06 AM  [name] is a 46 y.o. HD#4, admitted for left labial abscess. She is POD#2 s/p left labial abscess I&D/debridment..  Pregnancy complicated by: HTN, asthma, DM2, BMI 41, chronic back pain (on chronic narcotics), h/o ventral hernia repair with mesh  Overnight/24hr events:  none  Subjective:  No fevers, chills. Patient still with pain in the area like yesterday but doesn't seem worse.   Objective:     Current Vital Signs 24h Vital Sign Ranges  T 98.3 F (36.8 C) Temp  Avg: 98.1 F (36.7 C)  Min: 97.5 F (36.4 C)  Max: 98.5 F (36.9 C)  BP 100/60 BP  Min: 92/46  Max: 123/54  HR 84 Pulse  Avg: 77.1  Min: 64  Max: 86  RR 18 Resp  Avg: 18.9  Min: 18  Max: 20  SaO2 100 % Not Delivered SpO2  Avg: 99.6 %  Min: 98 %  Max: 100 %       24 Hour I/O Current Shift I/O  Time Ins Outs 06/25 0701 - 06/26 0700 In: 960 [P.O.:960] Out: 1000 [Urine:1000] No intake/output data recorded.   Patient Vitals for the past 24 hrs:  BP Temp Temp src Pulse Resp SpO2  05/08/17 0505 100/60 98.3 F (36.8 C) Oral 84 18 100 %  05/07/17 2357 (!) 110/46 98.5 F (36.9 C) Oral 82 18 100 %  05/07/17 2058 98/62 - - - - -  05/07/17 2008 (!) 92/46 98.2 F (36.8 C) Oral 64 20 99 %  05/07/17 1629 (!) 103/50 97.7 F (36.5 C) Axillary 83 20 100 %  05/07/17 1437 (!) 123/54 - - 86 20 98 %  05/07/17 1228 (!) 96/43 97.5 F (36.4 C) Oral 77 18 100 %  05/07/17 0823 (!) 99/43 98.1 F (36.7 C) Axillary 64 18 100 %    Physical exam: General: Well nourished, well developed female in no acute distress. GU: left packing in place with mild serosang (somewhat dry) old d/c on it. Mons and left labia and inguinal area appears normal and minimally ttp (similar to yesterday) Abdomen: nttp, obese Cardiovascular: S1, S2 normal, no murmur, rub or gallop, regular rate and rhythm Respiratory: CTAB Extremities: no clubbing, cyanosis or  edema Skin: Warm and dry.   Medications: Current Facility-Administered Medications  Medication Dose Route Frequency Provider Last Rate Last Dose  . acetaminophen (TYLENOL) tablet 650 mg  650 mg Oral Q6H PRN Dunseith Bing, MD      . albuterol (PROVENTIL) (2.5 MG/3ML) 0.083% nebulizer solution 3 mL  3 mL Inhalation Q4H PRN Hermina Staggers, MD      . clonazePAM Scarlette Calico) tablet 1 mg  1 mg Oral BID Hermina Staggers, MD   1 mg at 05/07/17 2140  . doxycycline (VIBRA-TABS) tablet 100 mg  100 mg Oral Q12H [place] Bing, MD   100 mg at 05/07/17 2150  . enoxaparin (LOVENOX) injection 40 mg  40 mg Subcutaneous Q24H Lazaro Arms, MD   40 mg at 05/07/17 1610  . fluticasone (FLONASE) 50 MCG/ACT nasal spray 1 spray  1 spray Each Nare Daily Hermina Staggers, MD   1 spray at 05/07/17 1029  . gabapentin (NEURONTIN) capsule 800 mg  800 mg Oral TID Hermina Staggers, MD   800 mg at 05/07/17 2141  . hydrochlorothiazide (MICROZIDE) capsule 12.5 mg  12.5 mg Oral Daily Hermina Staggers, MD   12.5 mg at 05/07/17 1234  .  HYDROmorphone (DILAUDID) injection 1-2 mg  1-2 mg Intravenous Q2H PRN Lazaro ArmsEure, Luther H, MD   2 mg at 05/08/17 0028  . ibuprofen (ADVIL,MOTRIN) tablet 800 mg  800 mg Oral Q8H PRN Hermina StaggersErvin, Michael L, MD   800 mg at 05/07/17 1044  . lubiprostone (AMITIZA) capsule 24 mcg  24 mcg Oral BID PRN Hermina StaggersErvin, Michael L, MD   24 mcg at 05/05/17 1335  . metoCLOPramide (REGLAN) tablet 10 mg  10 mg Oral BID Hermina StaggersErvin, Michael L, MD   10 mg at 05/07/17 2144  . mometasone-formoterol (DULERA) 200-5 MCG/ACT inhaler 2 puff  2 puff Inhalation BID Hermina StaggersErvin, Michael L, MD   2 puff at 05/07/17 2140  . oxyCODONE (Oxy IR/ROXICODONE) immediate release tablet 15 mg  15 mg Oral Q6H PRN [place] BingPickens, Elmus Mathes, MD   15 mg at 05/08/17 0508  . pantoprazole (PROTONIX) EC tablet 40 mg  40 mg Oral Daily Hermina StaggersErvin, Michael L, MD   40 mg at 05/07/17 1032  . pravastatin (PRAVACHOL) tablet 40 mg  40 mg Oral QHS Hermina StaggersErvin, Michael L, MD   40 mg at 05/07/17 2141   . sertraline (ZOLOFT) tablet 100 mg  100 mg Oral Daily Hermina StaggersErvin, Michael L, MD   100 mg at 05/07/17 1032  . sodium chloride 0.45 % 1,000 mL with potassium chloride 20 mEq/L Pediatric IV infusion   Intravenous Continuous Hermina StaggersErvin, Michael L, MD 50 mL/hr at 05/06/17 1704    . tobramycin (TOBREX) 0.3 % ophthalmic solution 2 drop  2 drop Right Eye Q6H Hermina StaggersErvin, Michael L, MD   2 drop at 05/08/17 [id number]  . traZODone (DESYREL) tablet 50 mg  50 mg Oral QHS PRN Hermina StaggersErvin, Michael L, MD      . Vitamin D (Ergocalciferol) (DRISDOL) capsule 50,000 Units  50,000 Units Oral Q7 days Lazaro ArmsEure, Luther H, MD   50,000 Units at 05/06/17 1703  . zinc oxide 20 % ointment   Topical BID  BingPickens, Julane Crock, MD      . zolpidem (AMBIEN) tablet 5 mg  5 mg Oral QHS PRN Hermina StaggersErvin, Michael L, MD   5 mg at 05/07/17 0014    Labs:   Recent Labs Lab 05/06/17 0509 05/07/17 0551 05/08/17 0550  WBC 11.3* 14.1* 10.0  HGB 10.5* 9.1* 8.5*  HCT 33.6* 28.4* 26.3*  PLT 364 363 333     Recent Labs Lab 05/05/17 0440 05/07/17 0551 05/08/17 0550  NA 136 135 137  K 3.7 3.9 3.8  CL 98* 103 104  CO2  --  24 28  BUN 10 15 17   CREATININE 0.90 1.14* 1.17*  CALCIUM  --  8.7* 8.8*  GLUCOSE 128* 126* 103*   Culture: new for today-->few staph aureus. Final cx still pending -rare WBCs, predominant PMNs  Results for Aretta NipRAMIREZ, Kanyon N (MRN [id number]) as of 05/08/2017 07:07  Ref. Range 05/07/2017 12:23 05/07/2017 14:37 05/07/2017 16:39 05/07/2017 18:34 05/07/2017 21:10  Glucose-Capillary Latest Ref Range: 65 - 99 mg/dL 696130 (H) 295107 (H) 284153 (H) 124 (H) 96   Radiology: no new imaging  Assessment & Plan:  Pt doing well *GYN: follow up final culture results. Doxycycline D#2. Change packing today and if still healing well, set up home health.  -s/p vanc and zosyn x 2d *DM2: continue current regimen *HTN: continue current regimen. Lisinopril being held due to AKI *AKI: labs stable. Repeat labs in AM. Home lasix and lisinopril being held since  yesterday *Asthma: continue current regimen *Chronic pain: continue current regimen *PPx: OOB ad lib, lovenox qday *FEN/GI:  saline lock IV, DM2 diet *Dispo: possibly tomorrow if [facility] can be set up.   Cornelia Copa MD Attending Center for [facility])

## 2017-05-09 DIAGNOSIS — Z22322 Carrier or suspected carrier of Methicillin resistant Staphylococcus aureus: Secondary | ICD-10-CM

## 2017-05-09 DIAGNOSIS — J4540 Moderate persistent asthma, uncomplicated: Secondary | ICD-10-CM | POA: Insufficient documentation

## 2017-05-09 DIAGNOSIS — E119 Type 2 diabetes mellitus without complications: Secondary | ICD-10-CM

## 2017-05-09 DIAGNOSIS — I10 Essential (primary) hypertension: Secondary | ICD-10-CM | POA: Insufficient documentation

## 2017-05-09 LAB — BASIC METABOLIC PANEL
ANION GAP: 8 (ref 5–15)
BUN: 18 mg/dL (ref 6–20)
CALCIUM: 8.9 mg/dL (ref 8.9–10.3)
CO2: 26 mmol/L (ref 22–32)
CREATININE: 1.17 mg/dL — AB (ref 0.44–1.00)
Chloride: 104 mmol/L (ref 101–111)
GFR calc Af Amer: >60 mL/min (ref >60)
GFR, EST NON AFRICAN AMERICAN: 55 mL/min — AB (ref >60)
GLUCOSE: 98 mg/dL (ref 65–99)
Potassium: 3.8 mmol/L (ref 3.5–5.1)
Sodium: 138 mmol/L (ref 135–145)

## 2017-05-09 LAB — CBC WITH DIFFERENTIAL/PLATELET
BASOS ABS: 0.1 10*3/uL (ref 0.0–0.1)
BASOS PCT: 1 %
EOS ABS: 0.2 10*3/uL (ref 0.0–0.7)
EOS PCT: 2 %
HCT: 27.1 % — ABNORMAL LOW (ref 36.0–46.0)
Hemoglobin: 8.9 g/dL — ABNORMAL LOW (ref 12.0–15.0)
Lymphocytes Relative: 37 %
Lymphs Abs: 3.3 10*3/uL (ref 0.7–4.0)
MCH: 27.6 pg (ref 26.0–34.0)
MCHC: 32.8 g/dL (ref 30.0–36.0)
MCV: 84.2 fL (ref 78.0–100.0)
MONO ABS: 0.4 10*3/uL (ref 0.1–1.0)
Monocytes Relative: 4 %
NEUTROS ABS: 5 10*3/uL (ref 1.7–7.7)
Neutrophils Relative %: 56 %
PLATELETS: 343 10*3/uL (ref 150–400)
RBC: 3.22 MIL/uL — ABNORMAL LOW (ref 3.87–5.11)
RDW: 16.1 % — AB (ref 11.5–15.5)
WBC: 9 10*3/uL (ref 4.0–10.5)

## 2017-05-09 LAB — GLUCOSE, CAPILLARY: GLUCOSE-CAPILLARY: 106 mg/dL — AB (ref 65–99)

## 2017-05-09 MED ORDER — DOXYCYCLINE HYCLATE 100 MG PO TABS: 100.0000 mg | ORAL_TABLET | Freq: Two times a day (BID) | ORAL | 0 refills | Status: DC

## 2017-05-09 MED ORDER — CHLORHEXIDINE GLUCONATE CLOTH 2 % EX PADS
6.0000 | MEDICATED_PAD | Freq: Every day | CUTANEOUS | Status: DC
  Administered 2017-05-09: 6 via TOPICAL

## 2017-05-09 MED ORDER — MUPIROCIN 2 % EX OINT
1.0000 "application " | TOPICAL_OINTMENT | Freq: Two times a day (BID) | CUTANEOUS | Status: DC
  Administered 2017-05-09: 1 via NASAL
  Filled 2017-05-09: qty 22

## 2017-05-09 MED ORDER — OXYCODONE HCL 15 MG PO TABS: 15.0000 mg | ORAL_TABLET | Freq: Four times a day (QID) | ORAL | 0 refills | Status: DC | PRN

## 2017-05-09 NOTE — Plan of Care (Signed)
Problem: Health Behavior/Discharge Planning: Goal: Ability to manage health-related needs will improve Outcome: Progressing Case Management consulted and involved in discharge plan and getting HH arranged.

## 2017-05-09 NOTE — Discharge Summary (Addendum)
Physician Discharge Summary  Patient ID: [name] MRN: [id number] DOB/AGE: Aug 20, 1971 46 y.o.  Admit date: 05/05/2017 Discharge date: 05/09/2017   Discharge Diagnoses:  Principal Problem:   Left genital labial abscess Active Problems:   AKI (acute kidney injury) (HCC)   MRSA (methicillin resistant staph aureus) culture positive   Diabetes type 2, controlled (HCC)  Chronic back pain with narcotic dependence  Consults: None  Significant Diagnostic Studies: labs:  CBC    Component Value Date/Time   WBC 9.0 05/09/2017 0624   RBC 3.22 (L) 05/09/2017 0624   HGB 8.9 (L) 05/09/2017 0624   HCT 27.1 (L) 05/09/2017 0624   PLT 343 05/09/2017 0624   MCV 84.2 05/09/2017 0624   MCH 27.6 05/09/2017 0624   MCHC 32.8 05/09/2017 0624   RDW 16.1 (H) 05/09/2017 0624   LYMPHSABS 3.3 05/09/2017 0624   MONOABS 0.4 05/09/2017 0624   EOSABS 0.2 05/09/2017 0624   BASOSABS 0.1 05/09/2017 0624   BMET    Component Value Date/Time   NA 138 05/09/2017 0624   K 3.8 05/09/2017 0624   CL 104 05/09/2017 0624   CO2 26 05/09/2017 0624   GLUCOSE 98 05/09/2017 0624   BUN 18 05/09/2017 0624   CREATININE 1.17 (H) 05/09/2017 0624   CALCIUM 8.9 05/09/2017 0624   GFRNONAA 55 (L) 05/09/2017 0624   GFRAA >60 05/09/2017 0624   CBG (last 3)   Recent Labs  05/08/17 2047 05/08/17 2324 05/09/17 0721  GLUCAP 148* 115* 106*   Hgb A1c MFr Bld 4.8 - 5.6 % 6.0      Specimen Description LABIA   Special Requests Normal   Gram Stain RARE WBC PRESENT, PREDOMINANTLY PMN  NO ORGANISMS SEEN  Performed at [facility], [address], [place], [place] [address]      Culture FEW METHICILLIN RESISTANT STAPHYLOCOCCUS AUREUS  NO ANAEROBES ISOLATED; CULTURE IN PROGRESS FOR 5 DAYS      Report Status PENDING   Organism ID, Bacteria METHICILLIN RESISTANT STAPHYLOCOCCUS AUREUS   Resulting Agency [facility]  Susceptibility    Methicillin resistant staphylococcus aureus    MIC    CIPROFLOXACIN >=8  RESISTANT  Resistant    CLINDAMYCIN <=0.25 SENSITIVE "><=0.25 SENS... Sensitive    ERYTHROMYCIN >=8 RESISTANT  Resistant    GENTAMICIN <=0.5 SENSITIVE "><=0.5 SENSI... Sensitive    Inducible Clindamycin NEGATIVE  Sensitive    OXACILLIN >=4 RESISTANT  Resistant    RIFAMPIN <=0.5 SENSITIVE "><=0.5 SENSI... Sensitive    TETRACYCLINE <=1 SENSITIVE "><=1 SENSITIVE  Sensitive    TRIMETH/SULFA <=10 SENSITIVE "><=10 SENSIT... Sensitive    VANCOMYCIN <=0.5 SENSITIVE "><=0.5 SENSI... Sensitive         Susceptibility Comments   Methicillin resistant staphylococcus aureus  FEW METHICILLIN RESISTANT STAPHYLOCOCCUS AUREUS     Ct Pelvis W Contrast  Result Date: 05/05/2017 CLINICAL DATA:  Vaginal swelling and suprapubic pain. Diabetic patient. EXAM: CT PELVIS WITH CONTRAST TECHNIQUE: Multidetector CT imaging of the pelvis was performed using the standard protocol following the bolus administration of intravenous contrast. CONTRAST:  ISOVUE-300 IOPAMIDOL (ISOVUE-300) INJECTION 61% COMPARISON:  CT 02/07/2017 FINDINGS: Urinary Tract: Urinary bladder is distended without wall thickening. The distal ureters are decompressed. Bowel: Possible mild sigmoid colonic diverticulosis. No acute diverticulitis. Normal appendix. No bowel inflammation. Small bowel extends into broad-based hernia of the anterior abdominal wall, unchanged from prior exam. Vascular/Lymphatic: Prominent bilateral external iliac and inguinal nodes, likely reactive. Reproductive:  Uterus and adnexa are normal. Other: Edematous appearance with soft tissue  edema and skin thickening of the left labia/perineum. There is no definite rim enhancing fluid collection. No soft tissue air to suggest necrotizing soft tissue infection. No pelvic free fluid. Musculoskeletal: There are no acute or suspicious osseous abnormalities. IMPRESSION: Inflammatory changes of the left labia with likely phlegmon, no discrete rim enhancing drainable fluid collection. No  soft tissue air to suggest necrotizing infection. Prominent bilateral external and inguinal lymph nodes are likely reactive. Electronically Signed   By: Rubye Oaks M.D.   On: 05/05/2017 05:30   Hospital Course: Patient admitted on 05/05/17. Patient had failed outpatient attempts at management, no evidence of necrotizing fasciitis on CT. Admitted placed on Vancomycin and Zosyn. Taken to OR for large debridement on 6/24. Cultures obtained. See Op note for details of procedure. She then had 3 days of W-->D dressing changes in the hospital. Patient remained afebrile, WBC decreased significantly. Pain was difficult to control due to chronic narcotic use. Wound appeared to be healing well and granulating well and she was felt to be stable for discharge.  Discharge exam: Blood pressure 109/62, pulse 78, temperature 98.6 F (37 C), temperature source Oral, resp. rate 18, height 5\' 5"  (1.651 m), weight 246 lb (111.6 kg), SpO2 96 %  Physical Examination: General appearance - alert, well appearing, and in no distress Chest - normal effort Abdomen - soft, nontender, nondistended, no masses or organomegaly Pelvic - left vulva without induration Procedure - packing removed. Excellent granulation tissue noted. Packing with some bleeding noted and small amount of purulence noted. Packed with 2 x 12 inch packing soaked with normal saline. Patient tolerated the procedure well. Extremities - Homan's sign negative bilaterally  Disposition: 01-Home or Self Care  Discharged Condition: stable  Discharge Instructions    Call MD for:  redness, tenderness, or signs of infection (pain, swelling, redness, odor or green/yellow discharge around incision site)    Complete by:  As directed    Call MD for:  temperature >100.4    Complete by:  As directed    Diet - low sodium heart healthy    Complete by:  As directed    Increase activity slowly    Complete by:  As directed      Allergies as of 05/09/2017   No  Known Allergies     Medication List    TAKE these medications   ADVAIR DISKUS 250-50 MCG/DOSE Aepb Generic drug:  Fluticasone-Salmeterol INHALE 1 PUFF BY MOUTH 1 TIME DAILY   albuterol (2.5 MG/3ML) 0.083% nebulizer solution Commonly known as:  PROVENTIL Take 3 mLs (2.5 mg total) by nebulization every 4 (four) hours as needed for wheezing. What changed:  additional instructions   albuterol 108 (90 Base) MCG/ACT inhaler Commonly known as:  PROVENTIL HFA;VENTOLIN HFA Inhale 2 puffs into the lungs every 4 (four) hours as needed for wheezing or shortness of breath. What changed:  Another medication with the same name was changed. Make sure you understand how and when to take each.   AMITIZA 24 MCG capsule Generic drug:  lubiprostone Take 24 mcg by mouth 2 (two) times daily as needed for constipation (takes 1 every morning and 2nd dose only if needed).   clonazePAM 1 MG tablet Commonly known as:  KLONOPIN clonazepam 1 mg tablet  take 1/4 tablet BY MOUTH TWICE DAILY   doxycycline 100 MG tablet Commonly known as:  VIBRA-TABS Take 1 tablet (100 mg total) by mouth 2 (two) times daily.   fluticasone 50 MCG/ACT nasal spray Commonly known as:  FLONASE Place 2 sprays into both nostrils daily. What changed:  when to take this  reasons to take this   fluticasone 50 MCG/ACT nasal spray Commonly known as:  FLONASE 1 spray by Each Nare route daily as needed for allergies What changed:  Another medication with the same name was changed. Make sure you understand how and when to take each.   furosemide 20 MG tablet Commonly known as:  LASIX Take 20 mg by mouth daily.   gabapentin 800 MG tablet Commonly known as:  NEURONTIN Take 800 mg by mouth 3 (three) times daily.   lisinopril-hydrochlorothiazide 10-12.5 MG tablet Commonly known as:  PRINZIDE,ZESTORETIC Take 1 tablet by mouth daily.   metoCLOPramide 10 MG tablet Commonly known as:  REGLAN Take 10 mg by mouth 2 (two) times  daily.   omeprazole 40 MG capsule Commonly known as:  PRILOSEC Take 40 mg by mouth daily.   oxyCODONE 15 MG immediate release tablet Commonly known as:  ROXICODONE Take 1 tablet (15 mg total) by mouth 4 (four) times daily as needed for pain.   pravastatin 40 MG tablet Commonly known as:  PRAVACHOL Take 40 mg by mouth at bedtime.   sertraline 100 MG tablet Commonly known as:  ZOLOFT Take 100 mg by mouth daily.   traZODone 50 MG tablet Commonly known as:  DESYREL Take 50 mg by mouth at bedtime as needed for sleep.   Vitamin D (Ergocalciferol) 50000 units Caps capsule Commonly known as:  DRISDOL 1 (ONE) CAPSULE CAPSULE WEEKLY ON SUNDAYS       Follow-up Information    [facility]   Specialty:  Obstetrics and Gynecology Why:  they will call you with an appointment Contact information: [address] [place] [id number] [phone]         Time spent in discharge activities with this patient including arranging home health, documenting, review of cultures, labs, meds, charting and dressing changes totaled 87 minutes.  Signed: Reva Boresanya S Vedha Tercero 05/09/2017, 10:38 AM

## 2017-05-09 NOTE — Discharge Instructions (Signed)
Perianal Abscess  An abscess is an infected area that is filled with pus. A perianal abscess occurs in the perineum, which is the area between the anus and the scrotum in males and between the anus and the vagina in females. Perianal abscesses can vary in size. Without treatment, a perianal abscess can become larger and cause other problems.  What are the causes?  This condition is caused by:  · Waste from damaged or dead tissue (debris) that plugs up glands in the perineum. When this happens, an abscess may form.  · Infections of the perineum.    What are the signs or symptoms?  Common symptoms of this condition include:  · Swelling and redness in the area of the abscess. The redness may go beyond the abscess and appear as a red streak on the skin.  · Pain in the area of the abscess, including pain when sitting, walking, or passing stool.    Other possible symptoms include:  · A visible, painful lump, or a lump that can be felt when touched.  · Bleeding or pus-like discharge from the area.  · Fever.  · General weakness.    How is this diagnosed?  This condition is diagnosed based on your medical history and a physical exam of the affected area.  · This may involve examining the rectal area with a gloved hand (digital rectal exam).  · Sometimes, the health care provider needs to look into the rectum using a probe or a scope.  · For women, it may require a careful vaginal exam.    How is this treated?  Treatment for this condition may include:  · Making a cut (incision) in the abscess to drain the pus. This can sometimes be done in your health care provider's office or an emergency department after you are given medicine to numb the area (local anesthetic).  · Surgery to drain the abscess. This is for larger or deeper abscesses.  · Antibiotic medicines, if there is infection in the surrounding tissue (cellulitis).  · Having gauze packed into the abscess to continue draining the area.  · Frequent baths in warm water  that is deep enough to cover your hips and buttocks (sitz baths). These help the wound heal and they make the abscess less likely to come back.    Follow these instructions at home:  Medicines  · Take over-the-counter and prescription medicines for pain, fever, or discomfort only as told by your health care provider.  · If you were prescribed an antibiotic medicine, use it as told by your health care provider. Do not stop using the antibiotic even if you start to feel better.  · Do not drive or use heavy machinery while taking prescription pain medicine.  Wound care    · Keep the skin around the wound clean and dry. Avoid cleaning the area too much.  · Avoid scratching the wound.  · Avoid using colored or perfumed toilet papers.  · Take a sitz bath 3-4 times a day and after bowel movements. This will help reduce pain and swelling.  · If directed, apply ice to the injured area:  ? Put ice in a plastic bag.  ? Place a towel between your skin and the bag.  ? Leave the ice on for 20 minutes, 2-3 times a day.  · Check your incision area every day for signs of infection. Check for:  ? More redness, swelling, or pain.  ? More fluid or blood.  ?   Warmth.  ? Pus or a bad smell.  Gauze  · If gauze was used in the abscess, follow instructions from your health care provider about removing or changing the gauze. It can usually be removed in 2-3 days.  · Wash your hands with soap and water before you remove or change your gauze. If soap and water are not available, use hand sanitizer.  · If one or more drains were placed in the abscess cavity, be careful not to pull at them. Your health care provider will tell you how long they need to remain in place.  General instructions  · Keep all follow-up visits as told by your health care provider. This is important.  Contact a health care provider if:  · You have trouble passing stool or passing urine.  · Your pain or swelling in the affected area does not seem to be getting  better.  · The gauze packing or the drains come out before the planned time.  Get help right away if:  · You have problems moving or using your legs.  · You have severe or increasing pain.  · Your swelling in the affected area suddenly gets worse.  · You have a large increase in bleeding or passing of pus.  · You have chills or a fever.  This information is not intended to replace advice given to you by your health care provider. Make sure you discuss any questions you have with your health care provider.  Document Released: 12/06/2006 Document Revised: 05/19/2016 Document Reviewed: 04/10/2016  Elsevier Interactive Patient Education © 2018 [employer]

## 2017-05-09 NOTE — Care Management Note (Signed)
Case Management Note  Patient Details  Name: [name] MRN: [id number] Date of Birth: 13-Sep-1971  Subjective/Objective:              Vulvar abcess that was I/D and now is requiring wound care      Action/Plan: daily dressing changes starting tomorrow 05/10/17    Expected Discharge Date:  05/09/17               Expected Discharge Plan:  [place]  In-House Referral:  Interpreting Services  Discharge planning Services  CM Consult  Post Acute Care Choice:  Home Health Choice offered to:  Patient   HH Arranged:  RN [facility] Agency:  [place]  Status of Service:  Completed, signed off   Additional Comments:  CM met with patient and husband in room with Eda Royal ([place] interpreter) to discuss discharge plan of care.  Choice of [place] agencies offered /list provided and patient did not have preference.  CM attempted to call [place] [facility] and they were unable to provide [facility] service and then [facility] was notified and they were unable to provide discharge needs. CM then called [facility] # [phone] and CM spoke to Meagan in intake and she accepted referral.  Name, MR number and diagnosis and what city she lived in given to [place].  Meagan stated she had access to EPIC and did not need information faxed to them.  Referral is for daily dressing changes starting tomorrow- Thursday 05/10/17  and patient will need spanish interpreter and [name] made aware of that at time of referral. Patient does have husband that works but verbalized via interpreter that he is open to learning how to do dressing changes with teaching. Patient and Husband verbalize understanding.  Number of [place]# [phone] and number of CM given to patient.  No other needs identified at this time.   Yong Channel, RN 05/09/2017, 11:24 AM

## 2017-05-09 NOTE — Progress Notes (Signed)
An interpreter was used for all discharge teaching, by MD and this RN. Pt and her SO verbalized understanding of d/c instructions, medications, follow up appts, when to seek medical attention, & belongings policy. MD did review d/c with pt as well. No questions prior to d/c. No IV's at time of d/c. Pt was escorted out with staff member, accompanied by SO. Given a copy of d/c instructions: english and spanish. Sheryn BisonGordon, Yue Flanigan Warner

## 2017-05-10 LAB — CULTURE, BLOOD (SINGLE)
CULTURE: NO GROWTH
Special Requests: ADEQUATE

## 2017-05-11 ENCOUNTER — Inpatient Hospital Stay (HOSPITAL_COMMUNITY)
Admission: AD | Admit: 2017-05-11 | Discharge: 2017-05-11 | Disposition: A | Discharge status: Home / Self Care | Payer: Medicaid Other | Source: Ambulatory Visit | Attending: Obstetrics & Gynecology | Admitting: Obstetrics & Gynecology

## 2017-05-11 ENCOUNTER — Encounter (HOSPITAL_COMMUNITY): Payer: Self-pay

## 2017-05-11 DIAGNOSIS — R109 Unspecified abdominal pain: Secondary | ICD-10-CM | POA: Insufficient documentation

## 2017-05-11 DIAGNOSIS — A4902 Methicillin resistant Staphylococcus aureus infection, unspecified site: Secondary | ICD-10-CM | POA: Diagnosis not present

## 2017-05-11 DIAGNOSIS — N764 Abscess of vulva: Secondary | ICD-10-CM | POA: Diagnosis not present

## 2017-05-11 DIAGNOSIS — E119 Type 2 diabetes mellitus without complications: Secondary | ICD-10-CM | POA: Insufficient documentation

## 2017-05-11 DIAGNOSIS — J45909 Unspecified asthma, uncomplicated: Secondary | ICD-10-CM | POA: Insufficient documentation

## 2017-05-11 DIAGNOSIS — N179 Acute kidney failure, unspecified: Secondary | ICD-10-CM | POA: Diagnosis not present

## 2017-05-11 DIAGNOSIS — R339 Retention of urine, unspecified: Secondary | ICD-10-CM | POA: Diagnosis not present

## 2017-05-11 DIAGNOSIS — I10 Essential (primary) hypertension: Secondary | ICD-10-CM | POA: Insufficient documentation

## 2017-05-11 DIAGNOSIS — Z79899 Other long term (current) drug therapy: Secondary | ICD-10-CM | POA: Diagnosis not present

## 2017-05-11 DIAGNOSIS — R338 Other retention of urine: Secondary | ICD-10-CM

## 2017-05-11 DIAGNOSIS — Z22322 Carrier or suspected carrier of Methicillin resistant Staphylococcus aureus: Secondary | ICD-10-CM

## 2017-05-11 LAB — URINALYSIS, ROUTINE W REFLEX MICROSCOPIC
BILIRUBIN URINE: NEGATIVE
GLUCOSE, UA: NEGATIVE mg/dL
Hgb urine dipstick: NEGATIVE
KETONES UR: NEGATIVE mg/dL
Leukocytes, UA: NEGATIVE
Nitrite: NEGATIVE
PH: 5 (ref 5.0–8.0)
Protein, ur: NEGATIVE mg/dL
Specific Gravity, Urine: 1.006 (ref 1.005–1.030)

## 2017-05-11 LAB — BASIC METABOLIC PANEL
Anion gap: 9 (ref 5–15)
BUN: 22 mg/dL — AB (ref 6–20)
CALCIUM: 9.1 mg/dL (ref 8.9–10.3)
CO2: 26 mmol/L (ref 22–32)
CREATININE: 2.03 mg/dL — AB (ref 0.44–1.00)
Chloride: 96 mmol/L — ABNORMAL LOW (ref 101–111)
GFR calc Af Amer: 33 mL/min — ABNORMAL LOW (ref >60)
GFR calc non Af Amer: 28 mL/min — ABNORMAL LOW (ref >60)
GLUCOSE: 108 mg/dL — AB (ref 65–99)
Potassium: 3.6 mmol/L (ref 3.5–5.1)
Sodium: 131 mmol/L — ABNORMAL LOW (ref 135–145)

## 2017-05-11 LAB — CBC WITH DIFFERENTIAL/PLATELET
BASOS PCT: 0 %
Basophils Absolute: 0 10*3/uL (ref 0.0–0.1)
EOS ABS: 0.3 10*3/uL (ref 0.0–0.7)
EOS PCT: 3 %
HEMATOCRIT: 26.8 % — AB (ref 36.0–46.0)
Hemoglobin: 8.5 g/dL — ABNORMAL LOW (ref 12.0–15.0)
Lymphocytes Relative: 28 %
Lymphs Abs: 2.7 10*3/uL (ref 0.7–4.0)
MCH: 26.8 pg (ref 26.0–34.0)
MCHC: 31.7 g/dL (ref 30.0–36.0)
MCV: 84.5 fL (ref 78.0–100.0)
MONO ABS: 0.5 10*3/uL (ref 0.1–1.0)
MONOS PCT: 5 %
NEUTROS ABS: 6.1 10*3/uL (ref 1.7–7.7)
Neutrophils Relative %: 64 %
Platelets: 376 10*3/uL (ref 150–400)
RBC: 3.17 MIL/uL — ABNORMAL LOW (ref 3.87–5.11)
RDW: 16 % — AB (ref 11.5–15.5)
WBC: 9.7 10*3/uL (ref 4.0–10.5)

## 2017-05-11 LAB — AEROBIC/ANAEROBIC CULTURE W GRAM STAIN (SURGICAL/DEEP WOUND)

## 2017-05-11 LAB — AEROBIC/ANAEROBIC CULTURE (SURGICAL/DEEP WOUND): SPECIAL REQUESTS: NORMAL

## 2017-05-11 MED ORDER — OXYCODONE-ACETAMINOPHEN 5-325 MG PO TABS
2.0000 | ORAL_TABLET | Freq: Once | ORAL | Status: AC
  Administered 2017-05-11: 2 via ORAL
  Filled 2017-05-11: qty 2

## 2017-05-11 MED ORDER — OXYCODONE HCL 15 MG PO TABS: 15.0000 mg | ORAL_TABLET | Freq: Four times a day (QID) | ORAL | 0 refills | Status: AC | PRN

## 2017-05-11 NOTE — Discharge Instructions (Signed)
Cuidado de sonda urinaria permanente en adultos  Indwelling Urinary Catheter Care, Adult  Si cuida adecuadamente de su sonda, har que funcione de manera correcta y ayudar a prevenir que surjan problemas.  Cmo usar su sonda  Sujete la sonda a su pierna con cinta adhesiva o una correa de pierna. Asegrese de que no haya tensin sobre la sonda. Si usa cinta adhesiva, primero quite cualquier residuo adhesivo de la cinta que us anteriormente.  Cmo usar una bolsa de drenaje   Usted debera haber recibido una bolsa de drenaje grande de uso nocturno y una bolsa de pierna ms pequea que se coloca debajo de la ropa. La bolsa de uso nocturno puede usarse en cualquier momento, pero la ms pequea nunca debe utilizarse durante la noche.   Mantenga la bolsa de drenaje de uso nocturno por debajo del nivel de la vejiga, pero mantngala separada del suelo. A la hora de dormir, cuelgue la bolsa dentro de una papelera cubierta con una bolsa de plstico limpia.   Use siempre la bolsa de pierna por debajo de su rodilla. Mantenga la bolsa de pierna fija con una correa de pierna o cinta adhesiva.  Cmo cuidar su piel   Limpie la piel que est alrededor de la sonda al menos una vez por da.   Dchese a diario. No tome baos de inmersin.   Aplquese cremas, lociones o ungentos en su zona genital nicamente segn lo indicado por su mdico.   No use talcos, aerosoles ni lociones en la zona genital.  Cmo limpiar la sonda y su piel  1. Lvese las manos con agua y jabn.  2. Moje un pao con agua tibia y jabn suave.  3. Use el pao para limpiar la parte de la piel donde la sonda entra en el cuerpo. Limpie con un movimiento hacia abajo, alejndose de la sonda en pequeos crculos. No limpie hacia la sonda. Esto puede hacer que las bacterias ingresen en la uretra y causar una infeccin.  4. Seque bien el rea con una toalla limpia dando golpecitos. Asegrese de quitar todo el jabn.  Cmo cuidar sus bolsas de drenaje  Vace la  bolsa de drenaje cuando se haya llenado hasta una tercera parte o hasta la mitad, o por lo menos 2 o 3veces al da. Cambie la bolsa de drenaje una vez por mes o antes, si empieza a tener mal olor o est sucia. No limpie la bolsa de drenaje a menos que as se lo indique su mdico.  Cmo vaciar la bolsa de drenaje    Materiales necesarios   Alcohol para fricciones.   Apsito de gasa o bola de algodn.   Cinta adhesiva o correa de pierna.    Pasos  1. Lvese las manos con agua y jabn.  2. Desprenda la bolsa de drenaje de su pierna.  3. Sostenga la bolsa de drenaje sobre el inodoro o un recipiente limpio. Mantenga la bolsa de drenaje por debajo de sus caderas y vejiga. Esto impide que la orina reingrese en la sonda y en su vejiga.  4. Abra el pico vertedor que est en el fondo de la bolsa.  5. Vace la orina en el inodoro o recipiente. No permita que el pico vertedor toque ninguna superficie. Esto mantiene a las bacterias fuera de la bolsa y ayuda a prevenir infecciones.  6. Coloque alcohol para fricciones en el apsito de gasa o la bola de algodn.  7. Use el apsito de gasa o la bola de algodn   para limpiar el pico vertedor.  8. Cierre el pico vertedor.  9. Sujete la bolsa a su pierna con cinta adhesiva o una correa de pierna.  10. Lvese las manos.    Cmo cambiar la bolsa de drenaje  Materiales necesarios   Toallitas impregnadas en alcohol.   Una bolsa de drenaje limpia.   Cinta adhesiva o correa de pierna.    Pasos  1. Lvese las manos con agua y jabn.  2. Desprenda la bolsa de drenaje sucia de su pierna.  3. Comprima la sonda de goma con sus dedos para que la orina no se derrame.  4. Desconecte la sonda del tubo de drenaje en la vlvula de conexin. No permita que la sonda o el tubo toque ninguna superficie.  5. Limpie el extremo de la sonda con un pao con alcohol. Use otro pao con alcohol para limpiar el extremo del tubo de drenaje.  6. Conecte la sonda al tubo de drenaje de la bolsa de drenaje  limpia.  7. Sujete la nueva bolsa a su pierna con cinta adhesiva o una correa de pierna. No la ajuste demasiado.  8. Lvese las manos.    Cmo prevenir una infeccin y otros problemas   Nunca tire de la sonda o trate de quitarla. Al tirar, podran daarse sus tejidos internos.   Lvese siempre las manos antes y despus de manipular la sonda.   Si se moja la correa de pierna, cmbiela por una seca.   Beba suficiente lquido para mantener la orina clara o de color amarillo plido, o segn las indicaciones del mdico.   No permita que la bolsa de drenaje ni la sonda toquen el suelo.   Use ropa interior de algodn. El algodn absorbe la humedad y mantiene su piel seca.   Si es mujer, higiencese de adelante hacia atrs despus de defecar.   Revise la sonda con frecuencia para asegurarse de que funcione correctamente y que no est enroscada ni doblada.  Comunquese con un mdico si:   Su orina es turbia.   Su orina huele inusualmente mal.   Su orina no drena dentro de la bolsa.   La sonda se obstruye.   La sonda empieza a tener filtraciones.   Siente que su vejiga est llena.  Solicite ayuda de inmediato si:   Tiene enrojecimiento, hinchazn o dolor donde la sonda ingresa en su cuerpo.   Tiene lquido, pus o mal olor que salen del rea donde la sonda ingresa en su cuerpo.   El rea donde la sonda ingresa en su cuerpo se siente clida al tacto.   Tiene fiebre.   Siente dolor en el abdomen, las piernas, la zona lumbar o la vejiga.   La sonda se llena con sangre.   La orina es de color rosa o rojo.   Tiene nuseas, vmitos o escalofros.   La sonda se ha salido del lugar.  Esta informacin no tiene como fin reemplazar el consejo del mdico. Asegrese de hacerle al mdico cualquier pregunta que tenga.  Document Released: 10/30/2005 Document Revised: 10/11/2016 Document Reviewed: 04/14/2014  Elsevier Interactive Patient Education  2018 [employer]

## 2017-05-11 NOTE — MAU Provider Note (Signed)
[facility]U Attending Note  History     CSN: [id number]  Arrival date & time 05/11/17  1539   First Provider Initiated Contact with Patient 05/11/17 1607      Chief Complaint  Patient presents with  . Abdominal Pain    [name] is a 46 y.o. G1P0 who presents to MAU today for evaluation of acute urinary retention. Patient is Spanish-speaking only, Spanish interpreter present for this encounter. Patient just underwent I&D of vulvar abscess on 05/07/17; was disharged to home on 05/09/17 with antibiotics and home health RN. Hopsital course notable for acute kidney injury, discharge Cr 1.2.  Reported urinating without difficulty until last night; she feels urge to go but is unable to go due and has pelvic pressure and back pain.  Denies any abnormal vaginal discharge, fevers, chills, sweats, nausea, vomiting, other GI or GU symptoms or other general symptoms.   Obstetric History   G1   P0   T0   P0   A0   L0    SAB0   TAB0   Ectopic0   Multiple0   Live Births0     # Outcome Date GA Lbr Len/2nd Weight Sex Delivery Anes PTL Lv  1 Gravida               Past Medical History:  Diagnosis Date  . Asthma   . Back pain   . Diabetes mellitus without complication (HCC)   . Hypertension     Past Surgical History:  Procedure Laterality Date  . FINGER SURGERY  2011  . HERNIA REPAIR    . INCISION AND DRAINAGE ABSCESS Left 05/06/2017   Procedure: INCISION AND DRAINAGE ABSCESS;  Surgeon: Lazaro Arms, MD;  Location: [facility];  Service: Gynecology;  Laterality: Left;  . TUBAL LIGATION  2004    Family History  Problem Relation Age of Onset  . Kidney failure Mother   . Heart attack Father     Social History  Substance Use Topics  . Smoking status: Never Smoker  . Smokeless tobacco: Never Used  . Alcohol use No    No Known Allergies  Prescriptions Prior to Admission  Medication Sig Dispense Refill Last Dose  . ADVAIR DISKUS 250-50 MCG/DOSE AEPB INHALE 1 PUFF  BY MOUTH 1 TIME DAILY  3 05/11/2017 at Unknown time  . albuterol (PROVENTIL HFA;VENTOLIN HFA) 108 (90 BASE) MCG/ACT inhaler Inhale 2 puffs into the lungs every 4 (four) hours as needed for wheezing or shortness of breath. 1 Inhaler 0 05/11/2017 at Unknown time  . albuterol (PROVENTIL) (2.5 MG/3ML) 0.083% nebulizer solution Take 3 mLs (2.5 mg total) by nebulization every 4 (four) hours as needed for wheezing. (Patient taking differently: Take 2.5 mg by nebulization every 4 (four) hours as needed for wheezing. SHORTNESS OF BREATH) 75 mL 0 05/11/2017 at Unknown time  . AMITIZA 24 MCG capsule Take 24 mcg by mouth 2 (two) times daily as needed for constipation (takes 1 every morning and 2nd dose only if needed).   6 05/11/2017 at Unknown time  . doxycycline (VIBRA-TABS) 100 MG tablet Take 1 tablet (100 mg total) by mouth 2 (two) times daily. 20 tablet 0 05/11/2017 at Unknown time  . etodolac (LODINE) 200 MG capsule Take 1 capsule by mouth every 6 (six) hours as needed.  0 05/11/2017 at Unknown time  . fluticasone (FLONASE) 50 MCG/ACT nasal spray Place 2 sprays into both nostrils daily. (Patient taking differently: Place 2 sprays into both nostrils  daily as needed for allergies. ) 16 g 2 05/11/2017 at Unknown time  . fluticasone (FLONASE) 50 MCG/ACT nasal spray 1 spray by Each Nare route daily as needed for allergies   05/11/2017 at Unknown time  . furosemide (LASIX) 20 MG tablet Take 20 mg by mouth daily.  3 05/11/2017 at Unknown time  . gabapentin (NEURONTIN) 800 MG tablet Take 800 mg by mouth 3 (three) times daily.  3 05/11/2017 at Unknown time  . lisinopril-hydrochlorothiazide (PRINZIDE,ZESTORETIC) 10-12.5 MG tablet Take 1 tablet by mouth daily.  6 05/11/2017 at Unknown time  . metoCLOPramide (REGLAN) 10 MG tablet Take 10 mg by mouth 2 (two) times daily.  1 05/11/2017 at Unknown time  . omeprazole (PRILOSEC) 40 MG capsule Take 40 mg by mouth daily.  3 05/11/2017 at Unknown time  . pravastatin (PRAVACHOL) 40 MG tablet  Take 40 mg by mouth at bedtime.  6 05/11/2017 at Unknown time  . sertraline (ZOLOFT) 100 MG tablet Take 100 mg by mouth daily.  3 05/11/2017 at Unknown time  . sulfamethoxazole-trimethoprim (BACTRIM DS,SEPTRA DS) 800-160 MG tablet Take 1 tablet by mouth 2 (two) times daily.  0 05/11/2017 at Unknown time  . traZODone (DESYREL) 50 MG tablet Take 50 mg by mouth at bedtime as needed for sleep.    05/10/2017 at Unknown time  . Vitamin D, Ergocalciferol, (DRISDOL) 50000 units CAPS capsule 1 (ONE) CAPSULE CAPSULE WEEKLY ON SUNDAYS  5 05/10/2017 at Unknown time  . [DISCONTINUED] oxyCODONE (ROXICODONE) 15 MG immediate release tablet Take 1 tablet (15 mg total) by mouth 4 (four) times daily as needed for pain.  0 Past Week at Unknown time  . clonazePAM (KLONOPIN) 1 MG tablet clonazepam 1 mg tablet  take 1/4 tablet BY MOUTH TWICE DAILY   05/04/2017 at Unknown time     Physical Exam  BP 126/66   Pulse 85   Temp 98.5 F (36.9 C)   Resp 18  GENERAL: Well-developed, well-nourished female in no acute distress  SKIN: Warm, dry and without erythema PSYCH: Normal mood and affect HEENT: Normocephalic, atraumatic.   LUNGS: Normal respiratory effort, normal breath sounds HEART: Regular rate noted ABDOMEN: Soft, nondistended, nontender, obese PELVIC: Vulvar incision with packing visible, no drainage. Overall edema but no erythema noted.  Mildly tender to touch in area.  Foley catheter/leg bag placed with 400 ml obtained instantaneously. EXTREMITIES: No edema, no cyanosis, normal range of movement  MAU Course/MDM  Leg bag placed, patient given instructions Results for orders placed or performed during the hospital encounter of 05/11/17 (from the past 24 hour(s))  Basic metabolic panel     Status: Abnormal   Collection Time: 05/11/17  4:26 PM  Result Value Ref Range   Sodium 131 (L) 135 - 145 mmol/L   Potassium 3.6 3.5 - 5.1 mmol/L   Chloride 96 (L) 101 - 111 mmol/L   CO2 26 22 - 32 mmol/L   Glucose, Bld 108  (H) 65 - 99 mg/dL   BUN 22 (H) 6 - 20 mg/dL   Creatinine, Ser 1.612.03 (H) 0.44 - 1.00 mg/dL   Calcium 9.1 8.9 - 09.610.3 mg/dL   GFR calc non Af Amer 28 (L) >60 mL/min   GFR calc Af Amer 33 (L) >60 mL/min   Anion gap 9 5 - 15  CBC with Differential/Platelet     Status: Abnormal   Collection Time: 05/11/17  4:26 PM  Result Value Ref Range   WBC 9.7 4.0 - 10.5 K/uL   RBC  3.17 (L) 3.87 - 5.11 MIL/uL   Hemoglobin 8.5 (L) 12.0 - 15.0 g/dL   HCT 81.1 (L) 91.4 - 78.2 %   MCV 84.5 78.0 - 100.0 fL   MCH 26.8 26.0 - 34.0 pg   MCHC 31.7 30.0 - 36.0 g/dL   RDW 95.6 (H) 21.3 - 08.6 %   Platelets 376 150 - 400 K/uL   Neutrophils Relative % 64 %   Neutro Abs 6.1 1.7 - 7.7 K/uL   Lymphocytes Relative 28 %   Lymphs Abs 2.7 0.7 - 4.0 K/uL   Monocytes Relative 5 %   Monocytes Absolute 0.5 0.1 - 1.0 K/uL   Eosinophils Relative 3 %   Eosinophils Absolute 0.3 0.0 - 0.7 K/uL   Basophils Relative 0 %   Basophils Absolute 0.0 0.0 - 0.1 K/uL  Urinalysis, Routine w reflex microscopic     Status: Abnormal   Collection Time: 05/11/17  4:55 PM  Result Value Ref Range   Color, Urine STRAW (A) YELLOW   APPearance CLEAR CLEAR   Specific Gravity, Urine 1.006 1.005 - 1.030   pH 5.0 5.0 - 8.0   Glucose, UA NEGATIVE NEGATIVE mg/dL   Hgb urine dipstick NEGATIVE NEGATIVE   Bilirubin Urine NEGATIVE NEGATIVE   Ketones, ur NEGATIVE NEGATIVE mg/dL   Protein, ur NEGATIVE NEGATIVE mg/dL   Nitrite NEGATIVE NEGATIVE   Leukocytes, UA NEGATIVE NEGATIVE    Assessment and Plan   1. Vulvar abscess   2. MRSA (methicillin resistant staph aureus) culture positive   3. Acute urinary retention     Patient still in AKI (Cr is 2.0), will hopefully decrease now that foley is in place and her bladder is resting. Follow up in [facility] for foley removal, trial of void on  05/14/17 Oxycodone given for pain Was told to return to MAU for any worsening pain, fevers, bleeding or other concerns, or if her condition were to change or  worsen. Discharged to home in stable condition   Follow-up Information    [facility] Follow up in 3 day(s).   Why:  Follow up Contact information: [address] [place] [address] [phone]         Allergies as of 05/11/2017   No Known Allergies     Medication List    TAKE these medications   ADVAIR DISKUS 250-50 MCG/DOSE Aepb Generic drug:  Fluticasone-Salmeterol INHALE 1 PUFF BY MOUTH 1 TIME DAILY   albuterol (2.5 MG/3ML) 0.083% nebulizer solution Commonly known as:  PROVENTIL Take 3 mLs (2.5 mg total) by nebulization every 4 (four) hours as needed for wheezing. What changed:  additional instructions   albuterol 108 (90 Base) MCG/ACT inhaler Commonly known as:  PROVENTIL HFA;VENTOLIN HFA Inhale 2 puffs into the lungs every 4 (four) hours as needed for wheezing or shortness of breath. What changed:  Another medication with the same name was changed. Make sure you understand how and when to take each.   AMITIZA 24 MCG capsule Generic drug:  lubiprostone Take 24 mcg by mouth 2 (two) times daily as needed for constipation (takes 1 every morning and 2nd dose only if needed).   clonazePAM 1 MG tablet Commonly known as:  KLONOPIN clonazepam 1 mg tablet  take 1/4 tablet BY MOUTH TWICE DAILY   doxycycline 100 MG tablet Commonly known as:  VIBRA-TABS Take 1 tablet (100 mg total) by mouth 2 (two) times daily.   etodolac 200 MG capsule Commonly known as:  LODINE Take 1 capsule  by mouth every 6 (six) hours as needed.   fluticasone 50 MCG/ACT nasal spray Commonly known as:  FLONASE Place 2 sprays into both nostrils daily. What changed:  when to take this  reasons to take this   fluticasone 50 MCG/ACT nasal spray Commonly known as:  FLONASE 1 spray by Each Nare route daily as needed for allergies What changed:  Another medication with the same name was changed. Make sure you understand how and when to take each.   furosemide 20  MG tablet Commonly known as:  LASIX Take 20 mg by mouth daily.   gabapentin 800 MG tablet Commonly known as:  NEURONTIN Take 800 mg by mouth 3 (three) times daily.   lisinopril-hydrochlorothiazide 10-12.5 MG tablet Commonly known as:  PRINZIDE,ZESTORETIC Take 1 tablet by mouth daily.   metoCLOPramide 10 MG tablet Commonly known as:  REGLAN Take 10 mg by mouth 2 (two) times daily.   omeprazole 40 MG capsule Commonly known as:  PRILOSEC Take 40 mg by mouth daily.   oxyCODONE 15 MG immediate release tablet Commonly known as:  ROXICODONE Take 1 tablet (15 mg total) by mouth 4 (four) times daily as needed for pain.   pravastatin 40 MG tablet Commonly known as:  PRAVACHOL Take 40 mg by mouth at bedtime.   sertraline 100 MG tablet Commonly known as:  ZOLOFT Take 100 mg by mouth daily.   sulfamethoxazole-trimethoprim 800-160 MG tablet Commonly known as:  BACTRIM DS,SEPTRA DS Take 1 tablet by mouth 2 (two) times daily.   traZODone 50 MG tablet Commonly known as:  DESYREL Take 50 mg by mouth at bedtime as needed for sleep.   Vitamin D (Ergocalciferol) 50000 units Caps capsule Commonly known as:  DRISDOL 1 (ONE) CAPSULE CAPSULE WEEKLY ON SUNDAYS        Jaynie Collins, MD, FACOG Attending Obstetrician & Gynecologist, [facility] [employer], [facility]

## 2017-05-11 NOTE — MAU Note (Signed)
Pt presents to MAU with complaints of lower abdominal pain. PT had left vulvar abscess and was taken to the OR on June the 24th to be drained by DR Despina HiddenEure due to failing outpatient treatment. Pt states she is now having trouble voiding.

## 2017-05-14 ENCOUNTER — Telehealth: Payer: Self-pay | Admitting: *Deleted

## 2017-05-14 ENCOUNTER — Encounter: Payer: Self-pay | Admitting: Obstetrics and Gynecology

## 2017-05-14 ENCOUNTER — Ambulatory Visit (INDEPENDENT_AMBULATORY_CARE_PROVIDER_SITE_OTHER): Payer: Medicaid Other | Admitting: Obstetrics and Gynecology

## 2017-05-14 VITALS — BP 121/66

## 2017-05-14 DIAGNOSIS — N179 Acute kidney failure, unspecified: Secondary | ICD-10-CM

## 2017-05-14 DIAGNOSIS — R339 Retention of urine, unspecified: Secondary | ICD-10-CM | POA: Insufficient documentation

## 2017-05-14 DIAGNOSIS — N764 Abscess of vulva: Secondary | ICD-10-CM

## 2017-05-14 DIAGNOSIS — Z22322 Carrier or suspected carrier of Methicillin resistant Staphylococcus aureus: Secondary | ICD-10-CM

## 2017-05-14 MED ORDER — ONDANSETRON 4 MG PO TBDP: 4.0000 mg | ORAL_TABLET | Freq: Four times a day (QID) | ORAL | 1 refills | Status: DC | PRN

## 2017-05-14 NOTE — Addendum Note (Signed)
Addended by: Hermina StaggersERVIN, Kippy Gohman L on: 05/14/2017 04:39 PM   Modules accepted: Orders

## 2017-05-14 NOTE — Progress Notes (Signed)
Patient ID: [name], female   DOB: 05/04/1971, 46 y.o.   MRN: [id number] Pt seen today for nurse visit, removal of foley secondary to urinary retention.  Pt with left labial abscess s/p I & D on 6/24. Dressing changes with Home Health. Taking Doxycycline. MRSA +  AKI while in hospital. Seen in MAU a few days after discharge for urinary retention. Foley placed. Pt reports doing well. Still has pain but pain medication helps Cycle started this past weekend.  Tolerating diet and no bowel dysfunction. But Doxycycline does upset stomach  PE AF VSS Lungs clear Heart RRR Abd soft + BS obese GU packing removed, tissue with good blood supply and granulation tissue noted, cleaned with saline and repacked with wet to dry gauze. Pt tolerated well  A/P Left Labial Abscess        Urinary rention        AKI        MRSA +         DM        HTN  Will continue and completed antibiotics. Continue with qday dressing changes with HH Zofran for nausea Foley removed today and pt voided 100 cc urine spontaneously. Will check BMP today. If Crt decreasing will continue with BP medicine, if not will need to change since it is ACE/HCTZ combination. Pt instructed to go to MAU if develops problems with urination. F/U office in 1 week

## 2017-05-14 NOTE — Telephone Encounter (Signed)
[facility] on nurse voice mail after close of business on 6/29.  She stated that she is seeing pt in home for treatment of Lt labial abscess.  Pt was complaining of difficulty with urination and abdominal pain. Per chart review, pt was seen @ MAU later the same day and treated for her complaints. No call back indicated at this time.

## 2017-05-14 NOTE — Progress Notes (Addendum)
Pt in for foley removal and trial of void. Foley removed without difficulty. Pt given water and told to let me know when she is ready to void.  Pt also requested dressing change. Discussed with Dr. Alysia PennaErvin and he stated that patient should see provider.  At 1555 patient voided 100 cc of urine. Advised that if she had any difficulty voiding later that she should come to MAU for evaluation. Patient voiced understanding and had no further questions or concerns.

## 2017-05-15 LAB — BASIC METABOLIC PANEL
BUN / CREAT RATIO: 9 (ref 9–23)
BUN: 10 mg/dL (ref 6–24)
CHLORIDE: 97 mmol/L (ref 96–106)
CO2: 25 mmol/L (ref 20–29)
Calcium: 9.8 mg/dL (ref 8.7–10.2)
Creatinine, Ser: 1.08 mg/dL — ABNORMAL HIGH (ref 0.57–1.00)
GFR calc Af Amer: 71 mL/min/{1.73_m2} (ref >59)
GFR calc non Af Amer: 62 mL/min/{1.73_m2} (ref >59)
GLUCOSE: 110 mg/dL — AB (ref 65–99)
Potassium: 5.2 mmol/L (ref 3.5–5.2)
SODIUM: 138 mmol/L (ref 134–144)

## 2017-05-17 ENCOUNTER — Encounter: Payer: Self-pay | Admitting: Obstetrics & Gynecology

## 2017-05-24 ENCOUNTER — Encounter: Payer: Self-pay | Admitting: Obstetrics and Gynecology

## 2017-05-24 ENCOUNTER — Ambulatory Visit (INDEPENDENT_AMBULATORY_CARE_PROVIDER_SITE_OTHER): Payer: Medicare Other | Admitting: Obstetrics and Gynecology

## 2017-05-24 ENCOUNTER — Other Ambulatory Visit (HOSPITAL_COMMUNITY)
Admission: RE | Admit: 2017-05-24 | Discharge: 2017-05-24 | Disposition: A | Discharge status: Home / Self Care | Payer: Medicaid Other | Source: Ambulatory Visit | Attending: Obstetrics & Gynecology | Admitting: Obstetrics & Gynecology

## 2017-05-24 VITALS — BP 134/77 | HR 78 | Ht 61.0 in | Wt 242.8 lb

## 2017-05-24 DIAGNOSIS — N898 Other specified noninflammatory disorders of vagina: Secondary | ICD-10-CM

## 2017-05-24 DIAGNOSIS — N179 Acute kidney failure, unspecified: Secondary | ICD-10-CM

## 2017-05-24 DIAGNOSIS — N764 Abscess of vulva: Secondary | ICD-10-CM | POA: Insufficient documentation

## 2017-05-24 LAB — POCT URINALYSIS DIP (DEVICE)
Bilirubin Urine: NEGATIVE
GLUCOSE, UA: NEGATIVE mg/dL
Hgb urine dipstick: NEGATIVE
Ketones, ur: NEGATIVE mg/dL
LEUKOCYTES UA: NEGATIVE
NITRITE: NEGATIVE
PROTEIN: NEGATIVE mg/dL
SPECIFIC GRAVITY, URINE: 1.015 (ref 1.005–1.030)
UROBILINOGEN UA: 0.2 mg/dL (ref 0.0–1.0)
pH: 7.5 (ref 5.0–8.0)

## 2017-05-24 MED ORDER — TRAMADOL HCL 50 MG PO TABS: 50.0000 mg | ORAL_TABLET | Freq: Four times a day (QID) | ORAL | 0 refills | Status: DC | PRN

## 2017-05-24 MED ORDER — NYSTATIN-TRIAMCINOLONE 100000-0.1 UNIT/GM-% EX OINT: 1.0000 "application " | TOPICAL_OINTMENT | Freq: Two times a day (BID) | CUTANEOUS | 1 refills | Status: DC

## 2017-05-24 NOTE — Progress Notes (Signed)
Patient ID: [name], female   DOB: 01/08/1971, 46 y.o.   MRN: [id number] Pt here today for follow up of labial abscess. Reports some problems with rash inguinal and buttocks area and slight vaginal discharge Has completed antibiotics. Has been doing on drsg changes.  PE AF VSS GU healing left labial abscess, length and depth decreased from last weeks visit  Packing removed and repacked Rash consistent with yeast  Wet prep obtained  A/P Left labial abscess        Yeast        AKI  Will continue with daily drsg changes. Husband taught had to perform. Nystatin for yeast. Repeat BMP Tramadol for pain F/U in 2 weeks

## 2017-05-25 LAB — BASIC METABOLIC PANEL
BUN/Creatinine Ratio: 15 (ref 9–23)
BUN: 10 mg/dL (ref 6–24)
CHLORIDE: 104 mmol/L (ref 96–106)
CO2: 27 mmol/L (ref 20–29)
Calcium: 9.2 mg/dL (ref 8.7–10.2)
Creatinine, Ser: 0.66 mg/dL (ref 0.57–1.00)
GFR calc Af Amer: 123 mL/min/{1.73_m2} (ref >59)
GFR, EST NON AFRICAN AMERICAN: 106 mL/min/{1.73_m2} (ref >59)
GLUCOSE: 116 mg/dL — AB (ref 65–99)
POTASSIUM: 4 mmol/L (ref 3.5–5.2)
SODIUM: 144 mmol/L (ref 134–144)

## 2017-05-25 LAB — CERVICOVAGINAL ANCILLARY ONLY
Bacterial vaginitis: NEGATIVE
CANDIDA VAGINITIS: NEGATIVE
CHLAMYDIA, DNA PROBE: NEGATIVE
NEISSERIA GONORRHEA: NEGATIVE
Trichomonas: NEGATIVE

## 2017-06-11 ENCOUNTER — Ambulatory Visit: Payer: Self-pay | Admitting: Obstetrics and Gynecology

## 2017-06-12 ENCOUNTER — Other Ambulatory Visit: Payer: Self-pay | Admitting: *Deleted

## 2017-06-12 MED ORDER — NYSTATIN 100000 UNIT/GM EX OINT: 1.0000 "application " | TOPICAL_OINTMENT | Freq: Two times a day (BID) | CUTANEOUS | 1 refills | Status: DC

## 2017-06-12 MED ORDER — TRIAMCINOLONE ACETONIDE 0.025 % EX OINT: 1.0000 "application " | TOPICAL_OINTMENT | Freq: Two times a day (BID) | CUTANEOUS | 1 refills | Status: DC

## 2017-06-12 NOTE — Progress Notes (Unsigned)
Pt's insurance will not cover Nystatin/Triamcinolone combination ointment but will cover both if prescribed separately. New Rx sent to pharmacy.

## 2019-03-06 ENCOUNTER — Other Ambulatory Visit: Payer: Self-pay | Admitting: Obstetrics & Gynecology

## 2019-03-06 ENCOUNTER — Other Ambulatory Visit: Payer: Self-pay

## 2019-03-06 DIAGNOSIS — N644 Mastodynia: Secondary | ICD-10-CM

## 2019-03-07 ENCOUNTER — Ambulatory Visit: Payer: Self-pay | Admitting: Obstetrics & Gynecology

## 2019-03-10 ENCOUNTER — Ambulatory Visit: Payer: Self-pay | Admitting: Obstetrics & Gynecology

## 2019-03-14 ENCOUNTER — Other Ambulatory Visit: Payer: Self-pay

## 2019-03-25 ENCOUNTER — Other Ambulatory Visit: Payer: Self-pay

## 2019-04-11 ENCOUNTER — Other Ambulatory Visit: Payer: Self-pay

## 2019-04-11 ENCOUNTER — Ambulatory Visit
Admission: RE | Admit: 2019-04-11 | Discharge: 2019-04-11 | Disposition: A | Discharge status: Home / Self Care | Payer: Medicare Other | Source: Ambulatory Visit | Attending: Obstetrics & Gynecology | Admitting: Obstetrics & Gynecology

## 2019-04-11 ENCOUNTER — Ambulatory Visit: Payer: Self-pay

## 2019-04-11 DIAGNOSIS — N644 Mastodynia: Secondary | ICD-10-CM

## 2020-02-05 ENCOUNTER — Emergency Department (HOSPITAL_COMMUNITY)
Admission: EM | Admit: 2020-02-05 | Discharge: 2020-02-05 | Disposition: A | Discharge status: Home / Self Care | Payer: Medicare Other | Attending: Emergency Medicine | Admitting: Emergency Medicine

## 2020-02-05 ENCOUNTER — Emergency Department (HOSPITAL_COMMUNITY): Payer: Medicare Other

## 2020-02-05 ENCOUNTER — Encounter (HOSPITAL_COMMUNITY): Payer: Self-pay | Admitting: Emergency Medicine

## 2020-02-05 ENCOUNTER — Other Ambulatory Visit: Payer: Self-pay

## 2020-02-05 DIAGNOSIS — Z7984 Long term (current) use of oral hypoglycemic drugs: Secondary | ICD-10-CM | POA: Diagnosis not present

## 2020-02-05 DIAGNOSIS — Z79899 Other long term (current) drug therapy: Secondary | ICD-10-CM | POA: Diagnosis not present

## 2020-02-05 DIAGNOSIS — E876 Hypokalemia: Secondary | ICD-10-CM | POA: Diagnosis not present

## 2020-02-05 DIAGNOSIS — R1030 Lower abdominal pain, unspecified: Secondary | ICD-10-CM | POA: Insufficient documentation

## 2020-02-05 DIAGNOSIS — E119 Type 2 diabetes mellitus without complications: Secondary | ICD-10-CM | POA: Diagnosis not present

## 2020-02-05 DIAGNOSIS — I10 Essential (primary) hypertension: Secondary | ICD-10-CM | POA: Insufficient documentation

## 2020-02-05 DIAGNOSIS — J45909 Unspecified asthma, uncomplicated: Secondary | ICD-10-CM | POA: Insufficient documentation

## 2020-02-05 LAB — COMPREHENSIVE METABOLIC PANEL
ALT: 17 U/L (ref 0–44)
AST: 28 U/L (ref 15–41)
Albumin: 4.3 g/dL (ref 3.5–5.0)
Alkaline Phosphatase: 68 U/L (ref 38–126)
Anion gap: 13 (ref 5–15)
BUN: 7 mg/dL (ref 6–20)
CO2: 25 mmol/L (ref 22–32)
Calcium: 9.4 mg/dL (ref 8.9–10.3)
Chloride: 100 mmol/L (ref 98–111)
Creatinine, Ser: 0.67 mg/dL (ref 0.44–1.00)
GFR calc Af Amer: >60 mL/min (ref >60)
GFR calc non Af Amer: >60 mL/min (ref >60)
Glucose, Bld: 161 mg/dL — ABNORMAL HIGH (ref 70–99)
Potassium: 2.9 mmol/L — ABNORMAL LOW (ref 3.5–5.1)
Sodium: 138 mmol/L (ref 135–145)
Total Bilirubin: 0.4 mg/dL (ref 0.3–1.2)
Total Protein: 8.8 g/dL — ABNORMAL HIGH (ref 6.5–8.1)

## 2020-02-05 LAB — CBC WITH DIFFERENTIAL/PLATELET
Abs Immature Granulocytes: 0.07 10*3/uL (ref 0.00–0.07)
Basophils Absolute: 0 10*3/uL (ref 0.0–0.1)
Basophils Relative: 0 %
Eosinophils Absolute: 0 10*3/uL (ref 0.0–0.5)
Eosinophils Relative: 0 %
HCT: 30.4 % — ABNORMAL LOW (ref 36.0–46.0)
Hemoglobin: 9.5 g/dL — ABNORMAL LOW (ref 12.0–15.0)
Immature Granulocytes: 1 %
Lymphocytes Relative: 15 %
Lymphs Abs: 1.7 10*3/uL (ref 0.7–4.0)
MCH: 25 pg — ABNORMAL LOW (ref 26.0–34.0)
MCHC: 31.3 g/dL (ref 30.0–36.0)
MCV: 80 fL (ref 80.0–100.0)
Monocytes Absolute: 0.4 10*3/uL (ref 0.1–1.0)
Monocytes Relative: 3 %
Neutro Abs: 9.3 10*3/uL — ABNORMAL HIGH (ref 1.7–7.7)
Neutrophils Relative %: 81 %
Platelets: 579 10*3/uL — ABNORMAL HIGH (ref 150–400)
RBC: 3.8 MIL/uL — ABNORMAL LOW (ref 3.87–5.11)
RDW: 16.5 % — ABNORMAL HIGH (ref 11.5–15.5)
WBC: 11.4 10*3/uL — ABNORMAL HIGH (ref 4.0–10.5)
nRBC: 0 % (ref 0.0–0.2)

## 2020-02-05 LAB — URINALYSIS, ROUTINE W REFLEX MICROSCOPIC
Bilirubin Urine: NEGATIVE
Glucose, UA: NEGATIVE mg/dL
Hgb urine dipstick: NEGATIVE
Ketones, ur: NEGATIVE mg/dL
Leukocytes,Ua: NEGATIVE
Nitrite: NEGATIVE
Protein, ur: NEGATIVE mg/dL
Specific Gravity, Urine: 1.005 (ref 1.005–1.030)
pH: 8 (ref 5.0–8.0)

## 2020-02-05 LAB — LACTIC ACID, PLASMA
Lactic Acid, Venous: 2.9 mmol/L (ref 0.5–1.9)
Lactic Acid, Venous: 1.2 mmol/L (ref 0.5–1.9)

## 2020-02-05 LAB — LIPASE, BLOOD: Lipase: 16 U/L (ref 11–51)

## 2020-02-05 LAB — I-STAT BETA HCG BLOOD, ED (MC, WL, AP ONLY): I-stat hCG, quantitative: <5 m[IU]/mL (ref <5)

## 2020-02-05 MED ORDER — HYDROMORPHONE HCL 1 MG/ML IJ SOLN
1.0000 mg | Freq: Once | INTRAMUSCULAR | Status: AC
  Administered 2020-02-05: 05:00:00 1 mg via INTRAVENOUS
  Filled 2020-02-05: qty 1

## 2020-02-05 MED ORDER — HYDROMORPHONE HCL 1 MG/ML IJ SOLN
1.0000 mg | Freq: Once | INTRAMUSCULAR | Status: AC
  Administered 2020-02-05: 09:00:00 1 mg via INTRAVENOUS
  Filled 2020-02-05: qty 1

## 2020-02-05 MED ORDER — SODIUM CHLORIDE 0.9% FLUSH: 3.0000 mL | Freq: Once | INTRAVENOUS | Status: DC

## 2020-02-05 MED ORDER — IOHEXOL 300 MG/ML  SOLN
100.0000 mL | Freq: Once | INTRAMUSCULAR | Status: AC | PRN
  Administered 2020-02-05: 07:00:00 100 mL via INTRAVENOUS

## 2020-02-05 MED ORDER — ONDANSETRON HCL 4 MG/2ML IJ SOLN
4.0000 mg | Freq: Once | INTRAMUSCULAR | Status: AC
  Administered 2020-02-05: 05:00:00 4 mg via INTRAVENOUS
  Filled 2020-02-05: qty 2

## 2020-02-05 MED ORDER — ONDANSETRON 4 MG PO TBDP: 4.0000 mg | ORAL_TABLET | Freq: Three times a day (TID) | ORAL | 0 refills | Status: AC | PRN

## 2020-02-05 MED ORDER — ACETAMINOPHEN ER 650 MG PO TBCR: 650.0000 mg | EXTENDED_RELEASE_TABLET | Freq: Three times a day (TID) | ORAL | 0 refills | Status: AC | PRN

## 2020-02-05 MED ORDER — POTASSIUM CHLORIDE 10 MEQ/100ML IV SOLN
10.0000 meq | INTRAVENOUS | Status: AC
  Administered 2020-02-05 (×2): 10 meq via INTRAVENOUS
  Filled 2020-02-05 (×2): qty 100

## 2020-02-05 MED ORDER — POTASSIUM CHLORIDE CRYS ER 20 MEQ PO TBCR
40.0000 meq | EXTENDED_RELEASE_TABLET | Freq: Once | ORAL | Status: AC
  Administered 2020-02-05: 40 meq via ORAL
  Filled 2020-02-05: qty 2

## 2020-02-05 MED ORDER — SODIUM CHLORIDE (PF) 0.9 % IJ SOLN
INTRAMUSCULAR | Status: AC
  Filled 2020-02-05: qty 50

## 2020-02-05 MED ORDER — POTASSIUM CHLORIDE CRYS ER 20 MEQ PO TBCR: 20.0000 meq | EXTENDED_RELEASE_TABLET | Freq: Every day | ORAL | 0 refills | Status: DC

## 2020-02-05 NOTE — ED Provider Notes (Signed)
Care assumed from [place], PA-C at shift change. See her note for full HPI.  In short, patient is a 49 year old female with a past medical history significant for hypertension, diabetes, hyperlipidemia, morbid obesity, and chronic abdominal pain who presents to the ED due to worsening lower abdominal pain x4 days.  Patient notes pain has been constant and severe for the past 2 days.  Denies associative fever.  Abdominal pain is associated with nausea since the onset of pain and nonbloody, nonbilious emesis that started today.  Last bowel movement was yesterday.  Patient has a history of previous hernia repairs.  Denies vaginal symptoms. She is currently sexually active with her husband. No concern for STDs at this time. Admits to increased frequency of urination, but denies dysuria.  Plan from previous provider: F/u on CT abdomen and reassess patient.   Physical Exam  BP (!) 188/123   Pulse 91   Temp 99.7 F (37.6 C) (Rectal)   Resp (!) 30   Ht 5\' 1"  (1.549 m)   Wt 95.3 kg   SpO2 100%   BMI 39.68 kg/m   Physical Exam Vitals and nursing note reviewed.  Constitutional:      General: She is not in acute distress.    Appearance: She is obese. She is not toxic-appearing.  HENT:     Head: Normocephalic.  Eyes:     Pupils: Pupils are equal, round, and reactive to light.  Cardiovascular:     Rate and Rhythm: Normal rate and regular rhythm.     Pulses: Normal pulses.     Heart sounds: Normal heart sounds. No murmur. No friction rub. No gallop.   Pulmonary:     Effort: Pulmonary effort is normal.     Breath sounds: Normal breath sounds.  Abdominal:     General: Abdomen is flat. Bowel sounds are normal. There is no distension.     Palpations: Abdomen is soft.     Tenderness: There is abdominal tenderness. There is no guarding or rebound.     Comments: Generalized abdominal tenderness.  No rebound or guarding.  No Peritoneal signs  Musculoskeletal:     Cervical back: Neck supple.     Comments: Able to move all 4 extremities without difficulty.  Skin:    General: Skin is warm and dry.  Neurological:     General: No focal deficit present.  Psychiatric:        Mood and Affect: Mood normal.        Behavior: Behavior normal.     ED Course/Procedures   Clinical Course as of Feb 04 899  Thu Feb 05, 2020  0702 Lactic Acid, Venous(!!): 2.9 [CA]  0703 WBC(!): 11.4 [CA]  0703 Hemoglobin(!): 9.5 [CA]  0703 Potassium(!): 2.9 [CA]  0813 Spoke to [employer], PA-C with general surgery who is going to have the doctor review the CT scan to determine if she is a surgical candidate or not   [CA]  0848 Spoke to [employer], PA-C who reviewed CT scans with doctor and note that nothing needs to be done from a surgical standpoint. They recommend outpatient follow-up.    [CA]  0900 Lactic Acid, Venous: 1.2 [CA]    Clinical Course User Index [CA] Suzy Bouchard, PA-C    Procedures  MDM  Care assumed from [place], [place]. See her note for full MDM.  In short, patient is a 49 year old female with chronic abdominal pain who presents due to worsening lower abdominal pain associated  with nausea and vomiting.  Patient is afebrile, not tachycardic or hypoxic.  Patient no acute distress and nontoxic-appearing.  Diffuse tenderness palpation throughout abdomen without rebound or guarding.  No peritoneal signs.  Previous provider obtain routine labs and CT abdomen.  CBC significant for leukocytosis at 11.4.  Hemoglobin at 9.5 which appears better than baseline.  Patient denies melena, hematochezia, and hematemesis.  Doubt GI bleed.  Lactate improved throughout patient's ED stay.  No concern for sepsis at this time.  CMP significant for hypokalemia at 2.9, but otherwise reassuring.  Patient given potassium here in the ED.  UA unremarkable with no signs of infection or hematuria.  CT abdomen personally reviewed which demonstrates: IMPRESSION:  1. No acute finding. No bowel  obstruction or visible inflammation to  explain symptoms.  2. Ventral hernia with mesh repair that is bulging and contains  small bowel loops.   Will consult general surgery for further treatment plan.  Discussed CT results with Zola Button, PA-C with general surgery.  See notes above.  Upon reassessment, patient notes her pain is controlled. Patient able to tolerate po at bedside without difficulty. Will discharge patient with symptomatic treatment.  Advised patient to follow-up with general surgeon within the next week for further evaluation. Strict ED precautions discussed with patient. Patient states understanding and agrees to plan. Patient discharged home in no acute distress and stable vitals  Discussed case with Dr. Criss Alvine who agrees with assessment and plan.     Mannie Stabile, PA-C 02/05/20 1027    Pricilla Loveless, MD 02/05/20 1102

## 2020-02-05 NOTE — ED Triage Notes (Signed)
Patient is complaining of abdominal pain that started yesterday. Patient states that she went to urgent care and urgent care sent her here.

## 2020-02-05 NOTE — ED Notes (Signed)
Pt has been given pain medicine at her request due to abd pain, she also is requesting sleeping medicine stating she can not sleep. She is aware we will not be able to give sleeping meds during the day.

## 2020-02-05 NOTE — ED Notes (Signed)
Pt c/o generalized abdominal pain and emesis x 1 day.  Pain score 10/10.  Pt reports she was seen at Urgent Care and an ED in [place].  Sts she LWBS at ED.  Pt believes she is bleeding internally.  Sts she has a hernia.

## 2020-02-05 NOTE — ED Provider Notes (Signed)
[facility] DEPT Provider Note   CSN: [id number] Arrival date & time: 02/05/20  0244     History Chief Complaint  Patient presents with  . Abdominal Pain    [name] is a 49 y.o. female.  Patient with a history of HTN, DM, HLD, morbid obesity, chronic sciatic pain, chronic abdominal pain, to ED with intermittent pain across her lower abdomen for the past 4 days that has been constant and severe for 2 days. No fever. She has had nausea since symptoms began with vomiting only today. Last bowel movement was yesterday and she states the pain became worse after she went. No blood in her stool. She denies cough, congestion. No urinary symptoms.   The history is provided by the patient and the spouse. A language interpreter was used (Husband at bedside acting as interpreter).  Abdominal Pain Associated symptoms: nausea and vomiting   Associated symptoms: no chills, no constipation, no dysuria, no fever and no shortness of breath        Past Medical History:  Diagnosis Date  . Asthma   . Back pain   . Diabetes mellitus without complication ([place])   . Hypertension     Patient Active Problem List   Diagnosis Date Noted  . MRSA (methicillin resistant staph aureus) culture positive 05/09/2017  . Hypertension, essential, benign 05/09/2017  . Diabetes type 2, controlled ([place]) 05/09/2017  . Morbid obesity ([place]) 05/09/2017  . Asthma, moderate persistent 05/09/2017  . AKI (acute kidney injury) ([place]) 05/07/2017  . Left genital labial abscess 05/05/2017    Past Surgical History:  Procedure Laterality Date  . FINGER SURGERY  2011  . HERNIA REPAIR    . INCISION AND DRAINAGE ABSCESS Left 05/06/2017   Procedure: INCISION AND DRAINAGE ABSCESS;  Surgeon: Florian Buff, MD;  Location: [facility];  Service: Gynecology;  Laterality: Left;  . TUBAL LIGATION  2004     OB History    Gravida  1   Para      Term      Preterm      AB      Living        SAB      TAB      Ectopic      Multiple      Live Births              Family History  Problem Relation Age of Onset  . Kidney failure Mother   . Heart attack Father   . Breast cancer Neg Hx     Social History   Tobacco Use  . Smoking status: Never Smoker  . Smokeless tobacco: Never Used  Substance Use Topics  . Alcohol use: No  . Drug use: No    Home Medications Prior to Admission medications   Medication Sig Start Date End Date Taking? Authorizing Provider  ADVAIR DISKUS 250-50 MCG/DOSE AEPB INHALE 1 PUFF BY MOUTH 1 TIME DAILY 08/25/16   [provider]  albuterol (PROVENTIL HFA;VENTOLIN HFA) 108 (90 BASE) MCG/ACT inhaler Inhale 2 puffs into the lungs every 4 (four) hours as needed for wheezing or shortness of breath. 10/06/13   Carvel Getting, NP  albuterol (PROVENTIL) (2.5 MG/3ML) 0.083% nebulizer solution Take 3 mLs (2.5 mg total) by nebulization every 4 (four) hours as needed for wheezing. Patient taking differently: Take 2.5 mg by nebulization every 4 (four) hours as needed for wheezing. SHORTNESS OF BREATH 09/10/13   Lucila Maine, PA-C  AMITIZA 24 MCG capsule Take 24 mcg by mouth 2 (two) times daily as needed for constipation (takes 1 every morning and 2nd dose only if needed).  08/25/16   [provider]  clonazePAM (KLONOPIN) 1 MG tablet clonazepam 1 mg tablet  take 1/4 tablet BY MOUTH TWICE DAILY    [provider]  etodolac (LODINE) 200 MG capsule Take 1 capsule by mouth every 6 (six) hours as needed. 05/04/17   [provider]  fluticasone (FLONASE) 50 MCG/ACT nasal spray Place 2 sprays into both nostrils daily. Patient taking differently: Place 2 sprays into both nostrils daily as needed for allergies.  10/06/13   Cathlyn Parsons, NP  fluticasone (FLONASE) 50 MCG/ACT nasal spray 1 spray by Each Nare route daily as needed for allergies 07/21/14   [provider]  gabapentin (NEURONTIN) 800 MG tablet Take 800  mg by mouth 3 (three) times daily. 08/25/16   [provider]  lisinopril-hydrochlorothiazide (PRINZIDE,ZESTORETIC) 10-12.5 MG tablet Take 1 tablet by mouth daily. 08/25/16   [provider]  metoCLOPramide (REGLAN) 10 MG tablet Take 10 mg by mouth 2 (two) times daily. 04/10/17   [provider]  nystatin ointment (MYCOSTATIN) Apply 1 application topically 2 (two) times daily. 06/12/17   Hermina Staggers, MD  omeprazole (PRILOSEC) 40 MG capsule Take 40 mg by mouth daily. 08/25/16   [provider]  ondansetron (ZOFRAN ODT) 4 MG disintegrating tablet Take 1 tablet (4 mg total) by mouth every 6 (six) hours as needed for nausea. 05/14/17   Hermina Staggers, MD  oxyCODONE (ROXICODONE) 15 MG immediate release tablet Take 1 tablet (15 mg total) by mouth 4 (four) times daily as needed for pain. 05/11/17   Anyanwu, Jethro Bastos, MD  pravastatin (PRAVACHOL) 40 MG tablet Take 40 mg by mouth at bedtime. 08/25/16   [provider]  sertraline (ZOLOFT) 100 MG tablet Take 100 mg by mouth daily. 03/12/17   [provider]  traMADol (ULTRAM) 50 MG tablet Take 1 tablet (50 mg total) by mouth every 6 (six) hours as needed. 05/24/17   Hermina Staggers, MD  traZODone (DESYREL) 50 MG tablet Take 50 mg by mouth at bedtime as needed for sleep.     [provider]  triamcinolone (KENALOG) 0.025 % ointment Apply 1 application topically 2 (two) times daily. 06/12/17   Hermina Staggers, MD  Vitamin D, Ergocalciferol, (DRISDOL) 50000 units CAPS capsule 1 (ONE) CAPSULE CAPSULE WEEKLY ON SUNDAYS 08/25/16   [provider]    Allergies    Patient has no known allergies.  Review of Systems   Review of Systems  Constitutional: Negative for chills and fever.  HENT: Negative.   Respiratory: Negative.  Negative for shortness of breath.   Cardiovascular: Negative.   Gastrointestinal: Positive for abdominal pain, nausea and vomiting. Negative for blood in stool and  constipation.  Genitourinary: Negative for dysuria and flank pain.  Musculoskeletal: Negative.   Skin: Negative.   Neurological: Negative.     Physical Exam Updated Vital Signs BP (!) 185/166 (BP Location: Right Arm) Comment: RN notified pt did not take BP meds today.  Pulse 78   Temp 98.9 F (37.2 C) (Oral)   Resp 20   Ht 5\' 1"  (1.549 m)   Wt 95.3 kg   SpO2 100%   BMI 39.68 kg/m   Physical Exam Vitals and nursing note reviewed.  Constitutional:      Appearance: She is well-developed. She is obese.  HENT:     Head: Normocephalic.  Cardiovascular:     Rate and Rhythm: Normal rate and regular rhythm.     Heart sounds: No murmur.  Pulmonary:     Effort: Pulmonary effort is normal.     Breath sounds: Normal breath sounds. No wheezing, rhonchi or rales.  Abdominal:     General: Bowel sounds are decreased.     Palpations: Abdomen is soft.     Tenderness: There is abdominal tenderness in the right lower quadrant, periumbilical area and left lower quadrant. There is no guarding or rebound.  Musculoskeletal:        General: Normal range of motion.     Cervical back: Normal range of motion and neck supple.  Skin:    General: Skin is warm and dry.     Findings: No rash.  Neurological:     Mental Status: She is alert and oriented to person, place, and time.     ED Results / Procedures / Treatments   Labs (all labs ordered are listed, but only abnormal results are displayed) Labs Reviewed  LIPASE, BLOOD  COMPREHENSIVE METABOLIC PANEL  URINALYSIS, ROUTINE W REFLEX MICROSCOPIC  CBC WITH DIFFERENTIAL/PLATELET  LACTIC ACID, PLASMA  LACTIC ACID, PLASMA  I-STAT BETA HCG BLOOD, ED ([facility], [facility], [facility] ONLY)   Results for orders placed or performed during the hospital encounter of 02/05/20  Lipase, blood  Result Value Ref Range   Lipase 16 11 - 51 U/L  Comprehensive metabolic panel  Result Value Ref Range   Sodium 138 135 - 145 mmol/L   Potassium 2.9 (L) 3.5 - 5.1 mmol/L    Chloride 100 98 - 111 mmol/L   CO2 25 22 - 32 mmol/L   Glucose, Bld 161 (H) 70 - 99 mg/dL   BUN 7 6 - 20 mg/dL   Creatinine, Ser 2.35 0.44 - 1.00 mg/dL   Calcium 9.4 8.9 - 57.3 mg/dL   Total Protein 8.8 (H) 6.5 - 8.1 g/dL   Albumin 4.3 3.5 - 5.0 g/dL   AST 28 15 - 41 U/L   ALT 17 0 - 44 U/L   Alkaline Phosphatase 68 38 - 126 U/L   Total Bilirubin 0.4 0.3 - 1.2 mg/dL   GFR calc non Af Amer >60 >60 mL/min   GFR calc Af Amer >60 >60 mL/min   Anion gap 13 5 - 15  Urinalysis, Routine w reflex microscopic  Result Value Ref Range   Color, Urine COLORLESS (A) YELLOW   APPearance CLEAR CLEAR   Specific Gravity, Urine 1.005 1.005 - 1.030   pH 8.0 5.0 - 8.0   Glucose, UA NEGATIVE NEGATIVE mg/dL   Hgb urine dipstick NEGATIVE NEGATIVE   Bilirubin Urine NEGATIVE NEGATIVE   Ketones, ur NEGATIVE NEGATIVE mg/dL   Protein, ur NEGATIVE NEGATIVE mg/dL   Nitrite NEGATIVE NEGATIVE   Leukocytes,Ua NEGATIVE NEGATIVE  CBC with Differential  Result Value Ref Range   WBC 11.4 (H) 4.0 - 10.5 K/uL   RBC 3.80 (L) 3.87 - 5.11 MIL/uL   Hemoglobin 9.5 (L) 12.0 - 15.0 g/dL   HCT 22.0 (L) 25.4 - 27.0 %   MCV 80.0 80.0 - 100.0 fL   MCH 25.0 (L) 26.0 - 34.0 pg   MCHC 31.3 30.0 - 36.0 g/dL   RDW 62.3 (H) 76.2 - 83.1 %   Platelets 579 (H) 150 - 400 K/uL   nRBC 0.0 0.0 - 0.2 %   Neutrophils Relative % 81 %   Neutro Abs  9.3 (H) 1.7 - 7.7 K/uL   Lymphocytes Relative 15 %   Lymphs Abs 1.7 0.7 - 4.0 K/uL   Monocytes Relative 3 %   Monocytes Absolute 0.4 0.1 - 1.0 K/uL   Eosinophils Relative 0 %   Eosinophils Absolute 0.0 0.0 - 0.5 K/uL   Basophils Relative 0 %   Basophils Absolute 0.0 0.0 - 0.1 K/uL   Immature Granulocytes 1 %   Abs Immature Granulocytes 0.07 0.00 - 0.07 K/uL  Lactic acid, plasma  Result Value Ref Range   Lactic Acid, Venous 2.9 (HH) 0.5 - 1.9 mmol/L  I-Stat beta hCG blood, ED  Result Value Ref Range   I-stat hCG, quantitative <5.0 <5 mIU/mL   Comment 3              EKG None  Radiology No results found.  Procedures Procedures (including critical care time)  Medications Ordered in ED Medications  sodium chloride flush (NS) 0.9 % injection 3 mL (0 mLs Intravenous Hold 02/05/20 0313)  HYDROmorphone (DILAUDID) injection 1 mg (has no administration in time range)  ondansetron (ZOFRAN) injection 4 mg (has no administration in time range)    ED Course  I have reviewed the triage vital signs and the nursing notes.  Pertinent labs & imaging results that were available during my care of the patient were reviewed by me and considered in my medical decision making (see chart for details).  Clinical Course as of Feb 04 702  Thu Feb 05, 2020  0702 Lactic Acid, Venous(!!): 2.9 [CA]  0703 WBC(!): 11.4 [CA]  0703 Hemoglobin(!): 9.5 [CA]  0703 Potassium(!): 2.9 [CA]    Clinical Course User Index [CA] Mannie Stabile, PA-C   MDM Rules/Calculators/A&P                      Patient to ED with lower abdominal pain, different from her chronic pain, with nausea/vomiting. No fever.   She is uncomfortable appearing. Blood pressure significantly elevated. She reports being able to take her medications as prescribed.   Per her husband, they went to an Urgent Care near [facility] yesterday but the wait was too long. He reports they did blood work which she was told was abnormal. They left and came here for further evaluation.   IV started, pain and nausea medication provided with relief. VS improved. CT pending.   She is mildly anemic at 9.5 where baseline seems to be around 8.5 to 9. Hypokalemia of 2.9. 2 runs of 20 mEq K+ ordered. Lactic acid is elevated at 2.9.   Patient care is signed out to Claudette Stapler, PA-C, pending CT results and patient re-evaluation to determine disposition.  Final Clinical Impression(s) / ED Diagnoses Final diagnoses:  None   1. Abdominal pain  Rx / DC Orders ED Discharge Orders    None       Danne Harbor 02/05/20 5093    Palumbo, April, MD 02/05/20 [phone]

## 2020-02-05 NOTE — ED Notes (Signed)
Pt taken to CT.

## 2020-02-05 NOTE — Discharge Instructions (Addendum)
As discussed, I spoke with surgery who did not feel like any surgical intervention needed to be performed at this time.  I am sending you home with nausea medication and pain medication.  Take as prescribed.  Please follow-up with your general surgeon in [facility] this week for further evaluation of your abdominal pain.  While you are in the ER your potassium was low likely due to to vomiting.  You were given potassium here in the ER and I am sending you home with potassium for the next 4 days.  Continue taking your chronic pain medication as prescribed.

## 2021-12-01 ENCOUNTER — Emergency Department (HOSPITAL_COMMUNITY): Payer: 59

## 2021-12-01 ENCOUNTER — Inpatient Hospital Stay (HOSPITAL_COMMUNITY)
Admission: EM | Admit: 2021-12-01 | Discharge: 2021-12-04 | DRG: 369 | Disposition: A | Discharge status: Home / Self Care | Payer: 59 | Attending: Internal Medicine | Admitting: Internal Medicine

## 2021-12-01 ENCOUNTER — Other Ambulatory Visit: Payer: Self-pay

## 2021-12-01 ENCOUNTER — Observation Stay (HOSPITAL_COMMUNITY): Payer: 59

## 2021-12-01 DIAGNOSIS — G894 Chronic pain syndrome: Secondary | ICD-10-CM | POA: Diagnosis present

## 2021-12-01 DIAGNOSIS — Z8719 Personal history of other diseases of the digestive system: Secondary | ICD-10-CM

## 2021-12-01 DIAGNOSIS — D500 Iron deficiency anemia secondary to blood loss (chronic): Secondary | ICD-10-CM | POA: Diagnosis present

## 2021-12-01 DIAGNOSIS — D649 Anemia, unspecified: Secondary | ICD-10-CM | POA: Diagnosis not present

## 2021-12-01 DIAGNOSIS — N179 Acute kidney failure, unspecified: Secondary | ICD-10-CM | POA: Diagnosis present

## 2021-12-01 DIAGNOSIS — X58XXXA Exposure to other specified factors, initial encounter: Secondary | ICD-10-CM | POA: Diagnosis present

## 2021-12-01 DIAGNOSIS — R040 Epistaxis: Secondary | ICD-10-CM | POA: Diagnosis present

## 2021-12-01 DIAGNOSIS — Z6833 Body mass index (BMI) 33.0-33.9, adult: Secondary | ICD-10-CM

## 2021-12-01 DIAGNOSIS — I10 Essential (primary) hypertension: Secondary | ICD-10-CM | POA: Diagnosis present

## 2021-12-01 DIAGNOSIS — K921 Melena: Secondary | ICD-10-CM | POA: Diagnosis present

## 2021-12-01 DIAGNOSIS — B3781 Candidal esophagitis: Principal | ICD-10-CM | POA: Diagnosis present

## 2021-12-01 DIAGNOSIS — J45909 Unspecified asthma, uncomplicated: Secondary | ICD-10-CM | POA: Diagnosis present

## 2021-12-01 DIAGNOSIS — T8089XA Other complications following infusion, transfusion and therapeutic injection, initial encounter: Secondary | ICD-10-CM | POA: Diagnosis not present

## 2021-12-01 DIAGNOSIS — G8929 Other chronic pain: Secondary | ICD-10-CM | POA: Diagnosis present

## 2021-12-01 DIAGNOSIS — R935 Abnormal findings on diagnostic imaging of other abdominal regions, including retroperitoneum: Secondary | ICD-10-CM | POA: Diagnosis present

## 2021-12-01 DIAGNOSIS — T402X5A Adverse effect of other opioids, initial encounter: Secondary | ICD-10-CM | POA: Diagnosis present

## 2021-12-01 DIAGNOSIS — Z79891 Long term (current) use of opiate analgesic: Secondary | ICD-10-CM

## 2021-12-01 DIAGNOSIS — Z7951 Long term (current) use of inhaled steroids: Secondary | ICD-10-CM

## 2021-12-01 DIAGNOSIS — Z7985 Long-term (current) use of injectable non-insulin antidiabetic drugs: Secondary | ICD-10-CM | POA: Diagnosis not present

## 2021-12-01 DIAGNOSIS — T18128A Food in esophagus causing other injury, initial encounter: Secondary | ICD-10-CM | POA: Diagnosis present

## 2021-12-01 DIAGNOSIS — D509 Iron deficiency anemia, unspecified: Secondary | ICD-10-CM

## 2021-12-01 DIAGNOSIS — Z79899 Other long term (current) drug therapy: Secondary | ICD-10-CM | POA: Diagnosis not present

## 2021-12-01 DIAGNOSIS — M25569 Pain in unspecified knee: Secondary | ICD-10-CM | POA: Diagnosis not present

## 2021-12-01 DIAGNOSIS — R1319 Other dysphagia: Secondary | ICD-10-CM

## 2021-12-01 DIAGNOSIS — K279 Peptic ulcer, site unspecified, unspecified as acute or chronic, without hemorrhage or perforation: Secondary | ICD-10-CM | POA: Diagnosis present

## 2021-12-01 DIAGNOSIS — Y848 Other medical procedures as the cause of abnormal reaction of the patient, or of later complication, without mention of misadventure at the time of the procedure: Secondary | ICD-10-CM | POA: Diagnosis not present

## 2021-12-01 DIAGNOSIS — T8092XA Unspecified transfusion reaction, initial encounter: Secondary | ICD-10-CM | POA: Diagnosis not present

## 2021-12-01 DIAGNOSIS — F32A Depression, unspecified: Secondary | ICD-10-CM | POA: Diagnosis present

## 2021-12-01 DIAGNOSIS — M79605 Pain in left leg: Secondary | ICD-10-CM | POA: Diagnosis present

## 2021-12-01 DIAGNOSIS — Y92239 Unspecified place in hospital as the place of occurrence of the external cause: Secondary | ICD-10-CM | POA: Diagnosis not present

## 2021-12-01 DIAGNOSIS — K2970 Gastritis, unspecified, without bleeding: Secondary | ICD-10-CM | POA: Diagnosis present

## 2021-12-01 DIAGNOSIS — E876 Hypokalemia: Secondary | ICD-10-CM | POA: Diagnosis present

## 2021-12-01 DIAGNOSIS — R1310 Dysphagia, unspecified: Secondary | ICD-10-CM

## 2021-12-01 DIAGNOSIS — M79604 Pain in right leg: Secondary | ICD-10-CM | POA: Diagnosis present

## 2021-12-01 DIAGNOSIS — Z8249 Family history of ischemic heart disease and other diseases of the circulatory system: Secondary | ICD-10-CM | POA: Diagnosis not present

## 2021-12-01 DIAGNOSIS — Z880 Allergy status to penicillin: Secondary | ICD-10-CM

## 2021-12-01 DIAGNOSIS — R220 Localized swelling, mass and lump, head: Secondary | ICD-10-CM | POA: Diagnosis not present

## 2021-12-01 DIAGNOSIS — R21 Rash and other nonspecific skin eruption: Secondary | ICD-10-CM | POA: Diagnosis present

## 2021-12-01 DIAGNOSIS — E669 Obesity, unspecified: Secondary | ICD-10-CM | POA: Diagnosis present

## 2021-12-01 DIAGNOSIS — K922 Gastrointestinal hemorrhage, unspecified: Secondary | ICD-10-CM | POA: Diagnosis not present

## 2021-12-01 DIAGNOSIS — T502X5A Adverse effect of carbonic-anhydrase inhibitors, benzothiadiazides and other diuretics, initial encounter: Secondary | ICD-10-CM | POA: Diagnosis present

## 2021-12-01 DIAGNOSIS — K5903 Drug induced constipation: Secondary | ICD-10-CM | POA: Diagnosis present

## 2021-12-01 DIAGNOSIS — Z20822 Contact with and (suspected) exposure to covid-19: Secondary | ICD-10-CM | POA: Diagnosis present

## 2021-12-01 DIAGNOSIS — E119 Type 2 diabetes mellitus without complications: Secondary | ICD-10-CM | POA: Diagnosis present

## 2021-12-01 DIAGNOSIS — K219 Gastro-esophageal reflux disease without esophagitis: Secondary | ICD-10-CM | POA: Diagnosis present

## 2021-12-01 LAB — CBC WITH DIFFERENTIAL/PLATELET
Abs Immature Granulocytes: 0.02 10*3/uL (ref 0.00–0.07)
Basophils Absolute: 0 10*3/uL (ref 0.0–0.1)
Basophils Relative: 1 %
Eosinophils Absolute: 0.1 10*3/uL (ref 0.0–0.5)
Eosinophils Relative: 2 %
HCT: 21.3 % — ABNORMAL LOW (ref 36.0–46.0)
Hemoglobin: 5.7 g/dL — CL (ref 12.0–15.0)
Immature Granulocytes: 0 %
Lymphocytes Relative: 34 %
Lymphs Abs: 2 10*3/uL (ref 0.7–4.0)
MCH: 18.6 pg — ABNORMAL LOW (ref 26.0–34.0)
MCHC: 26.8 g/dL — ABNORMAL LOW (ref 30.0–36.0)
MCV: 69.4 fL — ABNORMAL LOW (ref 80.0–100.0)
Monocytes Absolute: 0.5 10*3/uL (ref 0.1–1.0)
Monocytes Relative: 9 %
Neutro Abs: 3.1 10*3/uL (ref 1.7–7.7)
Neutrophils Relative %: 54 %
Platelets: 338 10*3/uL (ref 150–400)
RBC: 3.07 MIL/uL — ABNORMAL LOW (ref 3.87–5.11)
RDW: 21.7 % — ABNORMAL HIGH (ref 11.5–15.5)
WBC: 5.8 10*3/uL (ref 4.0–10.5)
nRBC: 0 % (ref 0.0–0.2)

## 2021-12-01 LAB — BASIC METABOLIC PANEL
Anion gap: 8 (ref 5–15)
BUN: <5 mg/dL — ABNORMAL LOW (ref 6–20)
CO2: 28 mmol/L (ref 22–32)
Calcium: 9.3 mg/dL (ref 8.9–10.3)
Chloride: 105 mmol/L (ref 98–111)
Creatinine, Ser: 0.64 mg/dL (ref 0.44–1.00)
GFR, Estimated: >60 mL/min (ref >60)
Glucose, Bld: 114 mg/dL — ABNORMAL HIGH (ref 70–99)
Potassium: 3.4 mmol/L — ABNORMAL LOW (ref 3.5–5.1)
Sodium: 141 mmol/L (ref 135–145)

## 2021-12-01 LAB — CBC
HCT: 26.5 % — ABNORMAL LOW (ref 36.0–46.0)
Hemoglobin: 7.1 g/dL — ABNORMAL LOW (ref 12.0–15.0)
MCH: 19.3 pg — ABNORMAL LOW (ref 26.0–34.0)
MCHC: 26.8 g/dL — ABNORMAL LOW (ref 30.0–36.0)
MCV: 72 fL — ABNORMAL LOW (ref 80.0–100.0)
Platelets: 358 10*3/uL (ref 150–400)
RBC: 3.68 MIL/uL — ABNORMAL LOW (ref 3.87–5.11)
RDW: 22.2 % — ABNORMAL HIGH (ref 11.5–15.5)
WBC: 5.8 10*3/uL (ref 4.0–10.5)
nRBC: 0 % (ref 0.0–0.2)

## 2021-12-01 LAB — HIV ANTIBODY (ROUTINE TESTING W REFLEX): HIV Screen 4th Generation wRfx: NONREACTIVE

## 2021-12-01 LAB — PROTIME-INR
INR: 1 (ref 0.8–1.2)
Prothrombin Time: 13.6 seconds (ref 11.4–15.2)

## 2021-12-01 LAB — URINALYSIS, COMPLETE (UACMP) WITH MICROSCOPIC
Bacteria, UA: NONE SEEN
Bilirubin Urine: NEGATIVE
Glucose, UA: NEGATIVE mg/dL
Ketones, ur: NEGATIVE mg/dL
Leukocytes,Ua: NEGATIVE
Nitrite: NEGATIVE
Protein, ur: NEGATIVE mg/dL
RBC / HPF: NONE SEEN RBC/hpf (ref 0–5)
Specific Gravity, Urine: 1.01 (ref 1.005–1.030)
Squamous Epithelial / HPF: NONE SEEN (ref 0–5)
WBC, UA: NONE SEEN WBC/hpf (ref 0–5)
pH: 7.5 (ref 5.0–8.0)

## 2021-12-01 LAB — RESP PANEL BY RT-PCR (FLU A&B, COVID) ARPGX2
Influenza A by PCR: NEGATIVE
Influenza B by PCR: NEGATIVE
SARS Coronavirus 2 by RT PCR: NEGATIVE

## 2021-12-01 LAB — VITAMIN B12: Vitamin B-12: 354 pg/mL (ref 180–914)

## 2021-12-01 LAB — COMPREHENSIVE METABOLIC PANEL
ALT: 11 U/L (ref 0–44)
AST: 19 U/L (ref 15–41)
Albumin: 3.2 g/dL — ABNORMAL LOW (ref 3.5–5.0)
Alkaline Phosphatase: 67 U/L (ref 38–126)
Anion gap: 9 (ref 5–15)
BUN: <5 mg/dL — ABNORMAL LOW (ref 6–20)
CO2: 23 mmol/L (ref 22–32)
Calcium: 8.9 mg/dL (ref 8.9–10.3)
Chloride: 106 mmol/L (ref 98–111)
Creatinine, Ser: 0.57 mg/dL (ref 0.44–1.00)
GFR, Estimated: >60 mL/min (ref >60)
Glucose, Bld: 91 mg/dL (ref 70–99)
Potassium: 3.5 mmol/L (ref 3.5–5.1)
Sodium: 138 mmol/L (ref 135–145)
Total Bilirubin: 0.1 mg/dL — ABNORMAL LOW (ref 0.3–1.2)
Total Protein: 7.1 g/dL (ref 6.5–8.1)

## 2021-12-01 LAB — RETICULOCYTES
Immature Retic Fract: 25.4 % — ABNORMAL HIGH (ref 2.3–15.9)
RBC.: 3.06 MIL/uL — ABNORMAL LOW (ref 3.87–5.11)
Retic Count, Absolute: 69.2 10*3/uL (ref 19.0–186.0)
Retic Ct Pct: 2.3 % (ref 0.4–3.1)

## 2021-12-01 LAB — IRON AND TIBC
Iron: 14 ug/dL — ABNORMAL LOW (ref 28–170)
Saturation Ratios: 3 % — ABNORMAL LOW (ref 10.4–31.8)
TIBC: 501 ug/dL — ABNORMAL HIGH (ref 250–450)
UIBC: 487 ug/dL

## 2021-12-01 LAB — PREPARE RBC (CROSSMATCH)

## 2021-12-01 LAB — FERRITIN: Ferritin: 4 ng/mL — ABNORMAL LOW (ref 11–307)

## 2021-12-01 LAB — FOLATE: Folate: 8.7 ng/mL (ref >5.9)

## 2021-12-01 MED ORDER — LORATADINE 10 MG PO TABS
10.0000 mg | ORAL_TABLET | Freq: Every day | ORAL | Status: DC | PRN
  Administered 2021-12-04: 10 mg via ORAL
  Filled 2021-12-01: qty 1

## 2021-12-01 MED ORDER — ACETAMINOPHEN 325 MG PO TABS
650.0000 mg | ORAL_TABLET | Freq: Four times a day (QID) | ORAL | Status: DC | PRN
  Administered 2021-12-01 – 2021-12-03 (×2): 650 mg via ORAL
  Filled 2021-12-01 (×2): qty 2

## 2021-12-01 MED ORDER — ONDANSETRON HCL 4 MG/2ML IJ SOLN: 4.0000 mg | Freq: Four times a day (QID) | INTRAMUSCULAR | Status: DC | PRN

## 2021-12-01 MED ORDER — FERROUS SULFATE 325 (65 FE) MG PO TABS: 325.0000 mg | ORAL_TABLET | Freq: Two times a day (BID) | ORAL | Status: DC

## 2021-12-01 MED ORDER — SODIUM CHLORIDE 0.9 % IV SOLN: 10.0000 mL/h | Freq: Once | INTRAVENOUS | Status: DC

## 2021-12-01 MED ORDER — ALBUTEROL SULFATE (2.5 MG/3ML) 0.083% IN NEBU: 2.5000 mg | INHALATION_SOLUTION | RESPIRATORY_TRACT | Status: DC | PRN

## 2021-12-01 MED ORDER — ACETAMINOPHEN 650 MG RE SUPP: 650.0000 mg | Freq: Four times a day (QID) | RECTAL | Status: DC | PRN

## 2021-12-01 MED ORDER — LUBIPROSTONE 24 MCG PO CAPS
24.0000 ug | ORAL_CAPSULE | Freq: Two times a day (BID) | ORAL | Status: DC | PRN
  Filled 2021-12-01: qty 1

## 2021-12-01 MED ORDER — PANTOPRAZOLE SODIUM 40 MG IV SOLR
40.0000 mg | Freq: Two times a day (BID) | INTRAVENOUS | Status: DC
  Administered 2021-12-01 (×2): 40 mg via INTRAVENOUS
  Filled 2021-12-01 (×2): qty 40

## 2021-12-01 MED ORDER — DULAGLUTIDE 1.5 MG/0.5ML ~~LOC~~ SOAJ: 1.5000 mg | SUBCUTANEOUS | Status: DC

## 2021-12-01 MED ORDER — ONDANSETRON HCL 4 MG PO TABS: 4.0000 mg | ORAL_TABLET | Freq: Four times a day (QID) | ORAL | Status: DC | PRN

## 2021-12-01 MED ORDER — GABAPENTIN 400 MG PO CAPS
800.0000 mg | ORAL_CAPSULE | Freq: Three times a day (TID) | ORAL | Status: DC
  Administered 2021-12-01 – 2021-12-04 (×9): 800 mg via ORAL
  Filled 2021-12-01 (×10): qty 2

## 2021-12-01 MED ORDER — FUROSEMIDE 20 MG PO TABS
20.0000 mg | ORAL_TABLET | Freq: Every day | ORAL | Status: DC
  Administered 2021-12-02: 20 mg via ORAL
  Filled 2021-12-01 (×2): qty 1

## 2021-12-01 MED ORDER — SERTRALINE HCL 100 MG PO TABS
100.0000 mg | ORAL_TABLET | Freq: Every day | ORAL | Status: DC
  Administered 2021-12-02 – 2021-12-04 (×3): 100 mg via ORAL
  Filled 2021-12-01 (×3): qty 1

## 2021-12-01 MED ORDER — MOMETASONE FURO-FORMOTEROL FUM 200-5 MCG/ACT IN AERO
2.0000 | INHALATION_SPRAY | Freq: Two times a day (BID) | RESPIRATORY_TRACT | Status: DC
  Administered 2021-12-02 – 2021-12-04 (×4): 2 via RESPIRATORY_TRACT
  Filled 2021-12-01 (×3): qty 8.8

## 2021-12-01 MED ORDER — IOHEXOL 300 MG/ML  SOLN
75.0000 mL | Freq: Once | INTRAMUSCULAR | Status: AC | PRN
  Administered 2021-12-01: 75 mL via INTRAVENOUS

## 2021-12-01 MED ORDER — PANTOPRAZOLE SODIUM 40 MG PO TBEC: 80.0000 mg | DELAYED_RELEASE_TABLET | Freq: Every day | ORAL | Status: DC

## 2021-12-01 MED ORDER — OXYCODONE HCL 5 MG PO TABS
15.0000 mg | ORAL_TABLET | Freq: Four times a day (QID) | ORAL | Status: DC | PRN
  Administered 2021-12-01 – 2021-12-04 (×5): 15 mg via ORAL
  Filled 2021-12-01 (×6): qty 3

## 2021-12-01 MED ORDER — TRAZODONE HCL 50 MG PO TABS
50.0000 mg | ORAL_TABLET | Freq: Every evening | ORAL | Status: DC | PRN
  Administered 2021-12-01: 50 mg via ORAL
  Filled 2021-12-01 (×3): qty 1

## 2021-12-01 MED ORDER — FLUTICASONE PROPIONATE 50 MCG/ACT NA SUSP: 2.0000 | Freq: Every day | NASAL | Status: DC | PRN

## 2021-12-01 MED ORDER — FERROUS SULFATE 325 (65 FE) MG PO TABS
325.0000 mg | ORAL_TABLET | Freq: Three times a day (TID) | ORAL | Status: DC
  Administered 2021-12-01 – 2021-12-03 (×7): 325 mg via ORAL
  Filled 2021-12-01 (×8): qty 1

## 2021-12-01 NOTE — Assessment & Plan Note (Signed)
She had been complaining of some midepigastric abdominal pain with the area being quite tender.  She has had previous hernia surgery x2 with mesh present and plans for a third surgery, according to family so CT scan of abdomen ordered.  Patient found to have small ring-enhancing lesion felt to be fluid collection.  Husband thought this might be related to where she injects herself with Trulicity.  We will monitor, patient does not look septic or this is from abscess.  If nothing else, patient needs to follow-up with her surgeon at [facility]

## 2021-12-01 NOTE — Hospital Course (Addendum)
51 year old female with past medical history of diabetes mellitus type 2, hypertension plus previous history of esophageal stricture requiring dilatation presented to the emergency room on the early morning of 1/19 after choking episode with complaints of several months of progressively worsening dysphagia and odynophagia.  She also had a nosebleed which made her worried that her low hemoglobin because she has felt short of breath since that time.  In the emergency room, patient noted to have hemoglobin of 5.8 with an MCV of 67 and stable vitals.  Patient admitted to the hospitalist service and gastroenterology consulted.  Patient reportedly was first diagnosed with low blood last summer, but has never gotten a transfusion.  Upon admission, patient was ordered transfusion of 2 unit packed red blood cells, however upon initiation of transfusion, patient started developing some facial swelling concerning for transfusion reaction and transfusion held.  Seen by GI who plans to do EGD and colonoscopy.  Interestingly, follow-up hemoglobin notes increase although patient did not receive much blood

## 2021-12-01 NOTE — ED Notes (Signed)
Pt notified RN that she was starting to feel some facial and eye swelling. RN stopped blood transfusion and notified admitting. VSS

## 2021-12-01 NOTE — Anesthesia Preprocedure Evaluation (Addendum)
Anesthesia Evaluation  Patient identified by MRN, date of birth, ID band Patient awake    Reviewed: Allergy & Precautions, NPO status , Patient's Chart, lab work & pertinent test results  Airway Mallampati: II  TM Distance: >3 FB Neck ROM: Full    Dental  (+) Poor Dentition, Dental Advisory Given, Chipped, Loose   Pulmonary asthma ,    Pulmonary exam normal breath sounds clear to auscultation       Cardiovascular hypertension, Pt. on medications  Rhythm:Regular Rate:Normal + Systolic murmurs    Neuro/Psych PSYCHIATRIC DISORDERS Depression negative neurological ROS     GI/Hepatic Neg liver ROS, GERD  Medicated,  Endo/Other  diabetes, Type 2Morbid obesity  Renal/GU Renal disease     Musculoskeletal negative musculoskeletal ROS (+)   Abdominal (+) + obese,   Peds  Hematology  (+) Blood dyscrasia, anemia ,   Anesthesia Other Findings Day of surgery medications reviewed with the patient.  Reproductive/Obstetrics                           Anesthesia Physical  Anesthesia Plan  ASA: 3  Anesthesia Plan: MAC   Post-op Pain Management: Minimal or no pain anticipated   Induction: Intravenous  PONV Risk Score and Plan: 2 and Ondansetron, Midazolam, Treatment may vary due to age or medical condition and Propofol infusion  Airway Management Planned: Natural Airway  Additional Equipment: None  Intra-op Plan:   Post-operative Plan:   Informed Consent: I have reviewed the patients History and Physical, chart, labs and discussed the procedure including the risks, benefits and alternatives for the proposed anesthesia with the patient or authorized representative who has indicated his/her understanding and acceptance.     Dental advisory given  Plan Discussed with: CRNA  Anesthesia Plan Comments:       Anesthesia Quick Evaluation

## 2021-12-01 NOTE — Assessment & Plan Note (Signed)
As discussed below.

## 2021-12-01 NOTE — Assessment & Plan Note (Signed)
-   Continue home medications 

## 2021-12-01 NOTE — Assessment & Plan Note (Addendum)
Had extensive discussion with patient and her family with use of a Nurse, learning disability.  Patient is postmenopausal and no longer has periods.  She has been seeing a doctor regularly at [facility] medical clinic.  They told her that she had a very low hemoglobin last summer and when lab work came back few days later, her family took her to the emergency room at [facility].  After waiting for 6 hours and not being seen, she then went to an urgent care where labs were reportedly checked and found to be unremarkable.  In review of her records, it looks like she had a referral for hematology at [facility] in August but did not go.  In looking at previous labs, her hemoglobin has run low in the past, but not as low as 5.7.  That said, interestingly, repeat lab work this morning noted hemoglobin of seven-point one of the patient had very little blood due to transfusion reaction.  Will consult hematology.  GI plans to take for EGD and colonoscopy.

## 2021-12-01 NOTE — H&P (Signed)
History and Physical    Cynitha RAKEL JUNIO ION:[id number] DOB: 02-21-1971 DOA: 12/01/2021  [facility]P: [facility]  Patient coming from: Home  I have personally briefly reviewed patient's old medical records in [facility]  Chief Complaint: SOB, choking  HPI: [name] is a 51 y.o. female with medical history significant of DM2, HTN, iron def anemia chronically, requiring transfusions in past.  Also has history of esophageal stricture requiring dilation.  Pt is post menopausal.  Pt saw PCP 11/01/2021, HGB in office at that time was 5.8.  Pt presents to ED with c/o choking episode.  Sounds like the patient has had a few months of progressively worsening dysphagia and odynophagia tonight she had a choking episode and afterwards she had a nosebleed which worried her with her low hemoglobin levels.  She is been short of breath since that time.  She presents here for further evaluation.  Pt also endorses high WBC history, but unsure why.   ED Course: WBC normal today, but HGB 5.7.  Pt is postmenopausal and denies vaginal bleeding.  Pt does endorse dark stools recently.   Past Medical History:  Diagnosis Date   Asthma    Back pain    Diabetes mellitus without complication (HCC)    Hypertension     Past Surgical History:  Procedure Laterality Date   FINGER SURGERY  2011   HERNIA REPAIR     INCISION AND DRAINAGE ABSCESS Left 05/06/2017   Procedure: INCISION AND DRAINAGE ABSCESS;  Surgeon: Lazaro Arms, MD;  Location: [facility];  Service: Gynecology;  Laterality: Left;   TUBAL LIGATION  2004     reports that she has never smoked. She has never used smokeless tobacco. She reports that she does not drink alcohol and does not use drugs.  Allergies  Allergen Reactions   Penicillins Itching    Family History  Problem Relation Age of Onset   Kidney failure Mother    Heart attack Father    Breast cancer Neg Hx     Prior to Admission medications    Medication Sig Start Date End Date Taking? Authorizing Provider  acetaminophen (TYLENOL 8 HOUR) 650 MG CR tablet Take 1 tablet (650 mg total) by mouth every 8 (eight) hours as needed for pain. 02/05/20  Yes Aberman, Caroline C, PA-C  ADVAIR DISKUS 250-50 MCG/DOSE AEPB Inhale 1 puff into the lungs in the morning and at bedtime. 08/25/16  Yes [provider]  albuterol (PROVENTIL HFA;VENTOLIN HFA) 108 (90 BASE) MCG/ACT inhaler Inhale 2 puffs into the lungs every 4 (four) hours as needed for wheezing or shortness of breath. 10/06/13  Yes Cathlyn Parsons, NP  AMITIZA 24 MCG capsule Take 24 mcg by mouth 2 (two) times daily as needed for constipation. 08/25/16  Yes [provider]  fluticasone (FLONASE) 50 MCG/ACT nasal spray Place 2 sprays into both nostrils daily. Patient taking differently: Place 2 sprays into both nostrils daily as needed for allergies. 10/06/13  Yes Cathlyn Parsons, NP  furosemide (LASIX) 20 MG tablet Take 20 mg by mouth daily. 09/16/21  Yes [provider]  gabapentin (NEURONTIN) 800 MG tablet Take 800 mg by mouth 3 (three) times daily. 08/25/16  Yes [provider]  loratadine (CLARITIN) 10 MG tablet Take 10 mg by mouth daily as needed for allergies. 09/15/21  Yes [provider]  naloxone (NARCAN) nasal spray 4 mg/0.1 mL Place 1 spray into the nose as needed (accidental overdose).   Yes [provider]  naproxen (NAPROSYN) 500 MG tablet Take 500 mg by mouth 2 (two) times daily. 11/08/21  Yes [provider]  omeprazole (PRILOSEC) 40 MG capsule Take 40 mg by mouth daily. 08/25/16  Yes [provider]  ondansetron (ZOFRAN ODT) 4 MG disintegrating tablet Take 1 tablet (4 mg total) by mouth every 8 (eight) hours as needed for nausea or vomiting. 02/05/20  Yes Aberman, Merla Richesaroline C, PA-C  oxyCODONE (ROXICODONE) 15 MG immediate release tablet Take 1 tablet (15 mg total) by mouth 4 (four) times daily as needed for pain.  05/11/17  Yes Anyanwu, Jethro BastosUgonna A, MD  phentermine (ADIPEX-P) 37.5 MG tablet Take 37.5 mg by mouth daily. 10/13/21  Yes [provider]  sertraline (ZOLOFT) 100 MG tablet Take 100 mg by mouth daily. 03/12/17  Yes [provider]  traZODone (DESYREL) 50 MG tablet Take 50 mg by mouth at bedtime as needed for sleep.    Yes [provider]  TRULICITY 1.5 MG/0.5ML SOPN Inject 1.5 mg into the skin every Friday. 11/08/21  Yes [provider]  Vitamin D, Ergocalciferol, (DRISDOL) 50000 units CAPS capsule Take 50,000 Units by mouth every Sunday. 08/25/16  Yes [provider]  albuterol (PROVENTIL) (2.5 MG/3ML) 0.083% nebulizer solution Take 3 mLs (2.5 mg total) by nebulization every 4 (four) hours as needed for wheezing. Patient not taking: Reported on 12/01/2021 09/10/13   Jillyn LedgerPalmer, Jessica K, PA-C    Physical Exam: Vitals:   12/01/21 0315 12/01/21 0338 12/01/21 0339 12/01/21 0349  BP: 133/77 139/75 139/75 (!) 151/81  Pulse: 83 75 72 75  Resp: 13 17 15 16   Temp:  98.1 F (36.7 C) 98.1 F (36.7 C) 98.1 F (36.7 C)  TempSrc:  Oral Oral Oral  SpO2: 100% 100% 100% 100%    Constitutional: NAD, calm, comfortable Eyes: PERRL, lids and conjunctivae Pale ENMT: Mucous membranes are moist. Posterior pharynx clear of any exudate or lesions.Normal dentition.  Neck: normal, supple, no masses, no thyromegaly Respiratory: clear to auscultation bilaterally, no wheezing, no crackles. Normal respiratory effort. No accessory muscle use.  Cardiovascular: Regular rate and rhythm, no murmurs / rubs / gallops. No extremity edema. 2+ pedal pulses. No carotid bruits.  Abdomen: no tenderness, no masses palpated. No hepatosplenomegaly. Bowel sounds positive.  Musculoskeletal: no clubbing / cyanosis. No joint deformity upper and lower extremities. Good ROM, no contractures. Normal muscle tone.  Skin: no rashes, lesions, ulcers. No induration Neurologic: CN 2-12 grossly intact.  Sensation intact, DTR normal. Strength 5/5 in all 4.  Psychiatric: Normal judgment and insight. Alert and oriented x 3. Normal mood.    Labs on Admission: I have personally reviewed following labs and imaging studies  CBC: Recent Labs  Lab 12/01/21 0136  WBC 5.8  NEUTROABS 3.1  HGB 5.7*  HCT 21.3*  MCV 69.4*  PLT 338   Basic Metabolic Panel: Recent Labs  Lab 12/01/21 0136  NA 138  K 3.5  CL 106  CO2 23  GLUCOSE 91  BUN <5*  CREATININE 0.57  CALCIUM 8.9   GFR: CrCl cannot be calculated (Unknown ideal weight.). Liver Function Tests: Recent Labs  Lab 12/01/21 0136  AST 19  ALT 11  ALKPHOS 67  BILITOT 0.1*  PROT 7.1  ALBUMIN 3.2*   No results for input(s): LIPASE, AMYLASE in the last 168 hours. No results for input(s): AMMONIA in the last 168 hours. Coagulation Profile: No results for input(s): INR, PROTIME in the last 168 hours. Cardiac Enzymes: No results for  input(s): CKTOTAL, CKMB, CKMBINDEX, TROPONINI in the last 168 hours. BNP (last 3 results) No results for input(s): PROBNP in the last 8760 hours. HbA1C: No results for input(s): HGBA1C in the last 72 hours. CBG: No results for input(s): GLUCAP in the last 168 hours. Lipid Profile: No results for input(s): CHOL, HDL, LDLCALC, TRIG, CHOLHDL, LDLDIRECT in the last 72 hours. Thyroid Function Tests: No results for input(s): TSH, T4TOTAL, FREET4, T3FREE, THYROIDAB in the last 72 hours. Anemia Panel: Recent Labs    12/01/21 0136  VITAMINB12 354  FOLATE 8.7  FERRITIN 4*  TIBC 501*  IRON 14*  RETICCTPCT 2.3   Urine analysis:    Component Value Date/Time   COLORURINE COLORLESS (A) 02/05/2020 0423   APPEARANCEUR CLEAR 02/05/2020 0423   LABSPEC 1.005 02/05/2020 0423   PHURINE 8.0 02/05/2020 0423   GLUCOSEU NEGATIVE 02/05/2020 0423   HGBUR NEGATIVE 02/05/2020 0423   BILIRUBINUR NEGATIVE 02/05/2020 0423   KETONESUR NEGATIVE 02/05/2020 0423   PROTEINUR NEGATIVE 02/05/2020 0423   UROBILINOGEN 0.2  05/24/2017 1313   NITRITE NEGATIVE 02/05/2020 0423   LEUKOCYTESUR NEGATIVE 02/05/2020 0423    Radiological Exams on Admission: DG Chest 2 View  Result Date: 12/01/2021 CLINICAL DATA:  Shortness of breath and recent choking episode EXAM: CHEST - 2 VIEW COMPARISON:  02/27/2017 FINDINGS: The heart size and mediastinal contours are within normal limits. Both lungs are clear. The visualized skeletal structures are unremarkable. IMPRESSION: No active cardiopulmonary disease. Electronically Signed   By: Alcide CleverMark  Lukens M.D.   On: 12/01/2021 03:35    EKG: Independently reviewed.  Assessment/Plan Principal Problem:   Iron deficiency anemia Active Problems:   Hypertension, essential, benign   Diabetes type 2, controlled (HCC)   Dysphagia   History of esophageal stricture    Iron def anemia - Transfuse 2u PRBC Repeat CBC in AM Starting PO iron pills Am suspicious that she may have chronic GI blood losses from her description: Dark stools Lack of other obvious source Note chronic daily NSAID use, while not the only possible cause of GIB, Id be completely unsurprised to find out shes got an NSAID ulcer). Given this, given history of esophageal stricture in past requiring dilation and given dysphagia symptoms today.  Am suspicious that pt will need EGD / GI work up as next steps. EDP sent message to GI for AM consult Clear liquid diet only HTN - Cont home BP meds DM2 - Cont trulicity  DVT prophylaxis: SCDs Code Status: Full Family Communication: Daughter at bedside Disposition Plan: Home after GI work up and anemia treatment Consults called: EDP sent message for AM consult Admission status: Place in 47obs    Sharmayne Jablon M. DO [facility]  How to contact the [facility] Attending or Consulting provider 7A - 7P or covering provider during after hours 7P -7A, for this patient?  Check the care team in [facility] and look for a) attending/consulting [facility] provider listed and b) the [facility] team  listed Log into [email or web address]  Amion Physician Scheduling and messaging for groups and whole hospitals  On call and physician scheduling software for group practices, residents, hospitalists and other medical providers for call, clinic, rotation and shift schedules. OnCall Enterprise is a hospital-wide system for scheduling doctors and paging doctors on call. EasyPlot is for scientific plotting and data analysis.  [email or web address]  and use [place]'s universal password to access. If you do not have the password, please contact the hospital operator.  Locate the [facility] provider you are looking for under  [facility] and page to a number that you can be directly reached. If you still have difficulty reaching the provider, please page the [facility] (Director on Call) for the Hospitalists listed on amion for assistance.  12/01/2021, 4:03 AM

## 2021-12-01 NOTE — ED Provider Notes (Signed)
[facility]   CSN: [id number] Arrival date & time: 12/01/21  0115     History  Chief Complaint  Patient presents with   Shortness of Breath   Choking    [name] is a 51 y.o. female.  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  Sounds like the patient has had a few months of progressively worsening dysphagia and odynophagia tonight she had a choking episode and afterwards she had a nosebleed which worried her with her low hemoglobin levels.  She is been short of breath since that time.  She presents here for further evaluation.  She also says that she has a high white blood cell count but she is unsure why.  There is no records to support any of this to help clarify   Shortness of Breath     Home Medications Prior to Admission medications   Medication Sig Start Date End Date Taking? Authorizing Provider  acetaminophen (TYLENOL 8 HOUR) 650 MG CR tablet Take 1 tablet (650 mg total) by mouth every 8 (eight) hours as needed for pain. 02/05/20  Yes Aberman, Caroline C, PA-C  ADVAIR DISKUS 250-50 MCG/DOSE AEPB Inhale 1 puff into the lungs in the morning and at bedtime. 08/25/16  Yes [provider]  albuterol (PROVENTIL HFA;VENTOLIN HFA) 108 (90 BASE) MCG/ACT inhaler Inhale 2 puffs into the lungs every 4 (four) hours as needed for wheezing or shortness of breath. 10/06/13  Yes Cathlyn ParsonsKabbe, Angela M, NP  AMITIZA 24 MCG capsule Take 24 mcg by mouth 2 (two) times daily as needed for constipation. 08/25/16  Yes [provider]  fluticasone (FLONASE) 50 MCG/ACT nasal spray Place 2 sprays into both nostrils daily. Patient taking differently: Place 2 sprays into both nostrils daily as  needed for allergies. 10/06/13  Yes Cathlyn ParsonsKabbe, Angela M, NP  furosemide (LASIX) 20 MG tablet Take 20 mg by mouth daily. 09/16/21  Yes [provider]  gabapentin (NEURONTIN) 800 MG tablet Take 800 mg by mouth 3 (three) times daily. 08/25/16  Yes [provider]  loratadine (CLARITIN) 10 MG tablet Take 10 mg by mouth daily as needed for allergies. 09/15/21  Yes [provider]  naloxone (NARCAN) nasal spray 4 mg/0.1 mL Place 1 spray into the nose as needed (accidental overdose).   Yes [provider]  naproxen (NAPROSYN) 500 MG tablet Take 500 mg by mouth 2 (two) times daily. 11/08/21  Yes [provider]  omeprazole (PRILOSEC) 40 MG capsule Take 40 mg by mouth daily. 08/25/16  Yes [provider]  ondansetron (ZOFRAN ODT) 4 MG disintegrating tablet Take 1 tablet (4 mg total) by mouth every 8 (eight) hours as needed for nausea or vomiting. 02/05/20  Yes Aberman, Merla Richesaroline C, PA-C  oxyCODONE (ROXICODONE) 15 MG immediate release tablet Take 1 tablet (15 mg total) by mouth 4 (four) times daily as needed for pain. 05/11/17  Yes Anyanwu, Jethro BastosUgonna A, MD  phentermine (ADIPEX-P) 37.5 MG tablet Take 37.5 mg by mouth daily. 10/13/21  Yes [provider]  sertraline (ZOLOFT) 100 MG tablet Take 100 mg by mouth daily. 03/12/17  Yes [provider]  traZODone (DESYREL) 50 MG tablet Take 50 mg by mouth at bedtime as needed for sleep.    Yes [provider]  TRULICITY 1.5 MG/0.5ML SOPN Inject 1.5 mg into the skin every Friday. 11/08/21  Yes [provider]  Vitamin D, Ergocalciferol, (DRISDOL) 50000 units CAPS capsule Take 50,000 Units by mouth every Sunday. 08/25/16  Yes [provider]  albuterol (PROVENTIL) (2.5 MG/3ML) 0.083% nebulizer solution Take 3 mLs (2.5 mg total) by nebulization every 4 (four) hours as needed for wheezing. Patient not taking: Reported on 12/01/2021 09/10/13   Jillyn Ledger, PA-C      Allergies     Penicillins    Review of Systems   Review of Systems  Respiratory:  Positive for shortness of breath.    Physical Exam Updated Vital Signs BP 134/72    Pulse 75    Temp 98.1 F (36.7 C) (Oral)    Resp 19    SpO2 100%  Physical Exam Vitals and nursing note reviewed.  Constitutional:      Appearance: She is well-developed.  HENT:     Head: Normocephalic and atraumatic.  Eyes:     Comments: Pale conjunctiva  Cardiovascular:     Rate and Rhythm: Normal rate and regular rhythm.  Pulmonary:     Effort: No respiratory distress.     Breath sounds: No stridor.  Abdominal:     General: There is no distension.  Musculoskeletal:        General: Normal range of motion.     Cervical back: Normal range of motion.     Right lower leg: No edema.     Left lower leg: No edema.  Skin:    General: Skin is warm and dry.  Neurological:     Mental Status: She is alert.    ED Results / Procedures / Treatments   Labs (all labs ordered are listed, but only abnormal results are displayed) Labs Reviewed  CBC WITH DIFFERENTIAL/PLATELET - Abnormal; Notable for the following components:      Result Value   RBC 3.07 (*)    Hemoglobin 5.7 (*)    HCT 21.3 (*)    MCV 69.4 (*)    MCH 18.6 (*)    MCHC 26.8 (*)    RDW 21.7 (*)    All other components within normal limits  COMPREHENSIVE METABOLIC PANEL - Abnormal; Notable for the following components:   BUN <5 (*)    Albumin 3.2 (*)    Total Bilirubin 0.1 (*)    All other components within normal limits  IRON AND TIBC - Abnormal; Notable for the following components:   Iron 14 (*)    TIBC 501 (*)    Saturation Ratios 3 (*)    All other components within normal limits  FERRITIN - Abnormal; Notable for the following components:   Ferritin 4 (*)    All other components within normal limits  RETICULOCYTES - Abnormal; Notable for the following components:   RBC. 3.06 (*)    Immature Retic Fract 25.4 (*)    All other components within normal  limits  RESP PANEL BY RT-PCR (FLU A&B, COVID) ARPGX2  VITAMIN B12  FOLATE  PROTIME-INR  HIV ANTIBODY (ROUTINE TESTING W REFLEX)  CBC  BASIC METABOLIC PANEL  TYPE AND SCREEN  PREPARE RBC (CROSSMATCH)  TRANSFUSION REACTION    EKG EKG Interpretation  Date/Time:  Thursday December 01 2021 01:25:57 EST Ventricular Rate:  78 PR Interval:  138 QRS Duration: 97 QT Interval:  387 QTC Calculation: 441 R Axis:   10 Text Interpretation: Sinus rhythm Confirmed by Marily Memos [phone])  on 12/01/2021 3:00:40 AM  Radiology DG Chest 2 View  Result Date: 12/01/2021 CLINICAL DATA:  Shortness of breath and recent choking episode EXAM: CHEST - 2 VIEW COMPARISON:  02/27/2017 FINDINGS: The heart size and mediastinal contours are within normal limits. Both lungs are clear. The visualized skeletal structures are unremarkable. IMPRESSION: No active cardiopulmonary disease. Electronically Signed   By: Alcide Clever M.D.   On: 12/01/2021 03:35    Procedures Procedures    Medications Ordered in ED Medications  0.9 %  sodium chloride infusion (has no administration in time range)  ferrous sulfate tablet 325 mg (has no administration in time range)  mometasone-formoterol (DULERA) 200-5 MCG/ACT inhaler 2 puff (has no administration in time range)  lubiprostone (AMITIZA) capsule 24 mcg (has no administration in time range)  albuterol (PROVENTIL) (2.5 MG/3ML) 0.083% nebulizer solution 2.5 mg (has no administration in time range)  furosemide (LASIX) tablet 20 mg (has no administration in time range)  fluticasone (FLONASE) 50 MCG/ACT nasal spray 2 spray (has no administration in time range)  sertraline (ZOLOFT) tablet 100 mg (has no administration in time range)  Dulaglutide SOPN 1.5 mg (has no administration in time range)  traZODone (DESYREL) tablet 50 mg (has no administration in time range)  oxyCODONE (Oxy IR/ROXICODONE) immediate release tablet 15 mg (has no administration in time range)  pantoprazole  (PROTONIX) EC tablet 80 mg (has no administration in time range)  loratadine (CLARITIN) tablet 10 mg (has no administration in time range)  gabapentin (NEURONTIN) tablet 800 mg (has no administration in time range)  acetaminophen (TYLENOL) tablet 650 mg (has no administration in time range)    Or  acetaminophen (TYLENOL) suppository 650 mg (has no administration in time range)  ondansetron (ZOFRAN) tablet 4 mg (has no administration in time range)    Or  ondansetron (ZOFRAN) injection 4 mg (has no administration in time range)    ED Course/ Medical Decision Making/ A&P                           Medical Decision Making Amount and/or Complexity of Data Reviewed Labs: ordered. Radiology: ordered.  Risk Prescription drug management. Decision regarding hospitalization.   Patient found to have hemoglobin of 5.7.  Postmenopausal.  Denies any GI bleeding.  Transfusion started and medicine consulted for admission.  Also will ask GI to consult for the esophageal strictures and possible GI bleed.   Final Clinical Impression(s) / ED Diagnoses Final diagnoses:  Symptomatic anemia  Food impaction of esophagus, initial encounter    Rx / DC Orders ED Discharge Orders     None         Sierah Lacewell, Barbara Cower, MD 12/01/21 0725

## 2021-12-01 NOTE — ED Triage Notes (Signed)
Pt bib [facility] from home . Earlier tonight pt was eating dinner with family and began choking on steak. Pt was able to clear own airway but began c/o SOB. Pt also started bleeding from her nose. Per family she has hx of low HGB as well, she gets transfusions regularly. She has not had one in a year. Last PCP visit her hgb was 5.8 on 11/01/2021.  EMS vitals 110/78 BP 78 HR 98 RA

## 2021-12-01 NOTE — Assessment & Plan Note (Signed)
Continue home medications plus bowel regimen

## 2021-12-01 NOTE — Assessment & Plan Note (Signed)
Continue home medications.  Blood pressure is elevated.  A reassuring sign that she is not having active bleeding.

## 2021-12-01 NOTE — Consult Note (Addendum)
Consultation  Referring Provider:     Dr. Gevena Barre Primary Care Physician:  [place] Primary Gastroenterologist:    [facility] versus Althia [name]     Reason for Consultation:    Anemia, dysphagia    Attending physician's note  I have taken a history, reviewed the chart and examined the patient. I performed a substantive portion of this encounter, including complete performance of at least one of the key components, in conjunction with the APP. I agree with the APP's note, impression and recommendations.    51 year old Spanish-speaking female admitted with severe symptomatic anemia and melena Hemoglobin 5.7 on admission, developed reaction to PRBC transfusion which was stopped.  Repeat H&H stable  Anemia likely subacute GI blood loss, no sign of active GI hemorrhage Differential includes possible gastroduodenal ulcer due to NSAID use and will also need to exclude H. pylori gastritis  We will continue to monitor Continue IV PPI   Consider IV Feraheme X2 to improve iron deficiency and may potentially reduce transfusion requirement  Will defer to primary team regarding PRBC transfusion  We will plan for EGD for evaluation and therapeutic intervention if needed once hemoglobin is>7 so patient can safely undergo the procedure with no complications associated with anesthesia or the procedure  If EGD unrevealing for source of GI bleed, may need colonoscopy for further evaluation during this hospitalization otherwise will defer surveillance colonoscopy to outpatient  The patient was provided an opportunity to ask questions and all were answered. The patient agreed with the plan and demonstrated an understanding of the instructions.  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 -Clear liquid diet, NPO at midnight. -Will ideally want HGB above 7 for EGD, primary team is working this up, consider hematology consult, iron infusions. -Plan for EGD potentially tomorrow or when hemoglobin allows, in the meantime will schedule for CT abdomen and pelvis with contrast due to abdominal discomfort, upper abdominal mass, seroma, mesh. - I thoroughly discussed the procedure to include nature, alternatives, benefits, and risks including but not limited to bleeding, perforation, infection, anesthesia/cardiac and pulmonary complications. Patient provides understanding and gave verbal consent to proceed. -Would give 10 mg IV Reglan 30 minutes prior to endoscopy due to trulicity use --If EGD is negative then will consider colonoscopy this visit, otherwise she is due for colonoscopy and may need 2 day prep with opioid use.  -Please obtain early AM CBC, CMET          HPI:   Nayra N Mizer is a 51 y.o. Spanish-speaking female with medical history significant for diabetes type  II, hypertension, chronic pain with opioid use, constipation secondary to opioid use, iron deficiency anemia, patient denies history of transfusions in the past. Patient had tubal ligation and history of recurrent laparoscopic hernia repair with recurrence 2017 with mesh, continue  to have pain had CT March 2021 that showed hernia repair with bulging of mesh and some small bowel in this area per note with general surgery.  Patient is naproxen 500 mg twice daily, Trulicity 1.5 mg Looking through records patient may have also had  endoscopy 2015 at [facility] for stricture s/p dilatation and colonoscopy, unable to see these reports.  Per patient she is due 2020 for repeat colonoscopy however this was delayed due to [place].  Patient had hemoglobin 5.8 primary care 11/01/2021. Presented to the ER 12/01/2021 with choking episode and dyspnea. Patient states she has had progressive dysphagia over 6 months, has been on Prilosec daily outpatient since her peptic ulcer in 2015.  Patient has issues with pills, progressing to food, denies any choking or issues with liquids. Having epigastric abdominal discomfort feels like her ulcer is back per patient, states milk helps the pain. Describes progressive dysphagia over the last several months, with worsening dysphagia and odynophagia overnight after choking episode.  She also has been having progressive shortness of breath shortness of breath, weakness. Also recounts a fall 2 months ago on left leg with continuing posterior knee pain and swelling.  Denies erythema. Has bowel movement 1-2 times daily, last time was 7 PM yesterday, states bowel movements have been more green, denies melena or hematochezia.  Does have some lower abdominal burning discomfort with bowel movement. Patient is on anti-inflammatories chronically, naproxen 500 mg twice daily, denies alcohol or smoking history. Patient did take iron a year ago but has been off iron for the past 6 to 12 months.  Patient had 160 pound weight loss in 2 years in preparation for hernia repair.  ED course: Potassium 3.5, BUN 5, creatinine 0.57, albumin 3.2, normal AST and ALT, total bilirubin low. Iron 14, TIBC 501, saturation 3, ferritin 4, B12 354. Hemoglobin 5.7 in the ER with microcytosis  69.4, platelets 338, white blood cell count 5.8.  Pending COVID and flu panel Patient started 1 unit packed red blood cells but began to have swelling so this was stopped. Normal chest x-ray.  Past Medical History:  Diagnosis Date   Asthma    Back pain    Diabetes mellitus without complication ([place])    Hypertension     Surgical History:  She  has a past surgical history that includes Finger surgery (2011); Tubal ligation (2004); Hernia repair; and Incision and drainage abscess (Left, 05/06/2017). Family History:  Her family history includes Heart attack in her father; Kidney failure in her mother. Social History:   reports that she has never smoked. She has never used smokeless tobacco. She reports that she does not drink alcohol and does not use drugs.  Prior to Admission medications   Medication Sig Start Date End Date Taking? Authorizing Provider  acetaminophen (TYLENOL 8 HOUR) 650 MG CR tablet Take 1 tablet (650 mg total) by mouth every 8 (eight) hours as needed for pain. 02/05/20  Yes Aberman, Caroline C, PA-C  ADVAIR DISKUS 250-50 MCG/DOSE AEPB Inhale 1 puff into the lungs in the morning and at bedtime. 08/25/16  Yes [provider]  albuterol (PROVENTIL HFA;VENTOLIN HFA) 108 (90 BASE) MCG/ACT inhaler Inhale 2 puffs into the lungs every 4 (four) hours as needed for wheezing or shortness of  breath. 10/06/13  Yes Carvel Getting, NP  AMITIZA 24 MCG capsule Take 24 mcg by mouth 2 (two) times daily as needed for constipation. 08/25/16  Yes [provider]  fluticasone (FLONASE) 50 MCG/ACT nasal spray Place 2 sprays into both nostrils daily. Patient taking differently: Place 2 sprays into both nostrils daily as needed for allergies. 10/06/13  Yes Carvel Getting, NP  furosemide (LASIX) 20 MG tablet Take 20 mg by mouth daily. 09/16/21  Yes [provider]  gabapentin (NEURONTIN) 800 MG tablet Take 800 mg by mouth 3 (three) times daily. 08/25/16  Yes [provider]  loratadine (CLARITIN) 10 MG tablet Take 10 mg by mouth daily as needed for allergies. 09/15/21  Yes [provider]  naloxone (NARCAN) nasal spray 4 mg/0.1 mL Place 1 spray into the nose as needed (accidental overdose).   Yes [provider]  naproxen (NAPROSYN) 500 MG tablet Take 500 mg by mouth 2 (two) times daily. 11/08/21  Yes [provider]  omeprazole (PRILOSEC) 40 MG capsule Take 40 mg by mouth daily. 08/25/16  Yes [provider]  ondansetron (ZOFRAN ODT) 4 MG disintegrating tablet Take 1 tablet (4 mg total) by mouth every 8 (eight) hours as needed for nausea or vomiting. 02/05/20  Yes Aberman, Druscilla Brownie, PA-C  oxyCODONE (ROXICODONE) 15 MG immediate release tablet Take 1 tablet (15 mg total) by mouth 4 (four) times daily as needed for pain. 05/11/17  Yes Anyanwu, Sallyanne Havers, MD  phentermine (ADIPEX-P) 37.5 MG tablet Take 37.5 mg by mouth daily. 10/13/21  Yes [provider]  sertraline (ZOLOFT) 100 MG tablet Take 100 mg by mouth daily. 03/12/17  Yes [provider]  traZODone (DESYREL) 50 MG tablet Take 50 mg by mouth at bedtime as needed for sleep.    Yes [provider]  TRULICITY 1.5 0000000 SOPN Inject 1.5 mg into the skin every Friday. 11/08/21  Yes [provider]  Vitamin D, Ergocalciferol, (DRISDOL) 50000 units CAPS capsule Take 50,000 Units by mouth every Sunday. 08/25/16  Yes [provider]  albuterol (PROVENTIL) (2.5 MG/3ML) 0.083% nebulizer solution Take 3 mLs (2.5 mg total) by nebulization every 4 (four) hours as needed for wheezing. Patient not taking: Reported on 12/01/2021 09/10/13   Lucila Maine, PA-C    Current Facility-Administered Medications  Medication Dose Route Frequency Provider Last Rate Last Admin   0.9 %  sodium chloride infusion  10 mL/hr Intravenous Once Mesner, Corene Cornea, MD       acetaminophen (TYLENOL) tablet 650 mg  650 mg Oral Q6H PRN Etta Quill, DO        Or   acetaminophen (TYLENOL) suppository 650 mg  650 mg Rectal Q6H PRN Etta Quill, DO       albuterol (PROVENTIL) (2.5 MG/3ML) 0.083% nebulizer solution 2.5 mg  2.5 mg Inhalation Q4H PRN Etta Quill, DO       [START ON 12/02/2021] Dulaglutide SOPN 1.5 mg  1.5 mg Subcutaneous Q Fri Gardner, Jared M, DO       ferrous sulfate tablet 325 mg  325 mg Oral TID WC Etta Quill, DO       fluticasone (FLONASE) 50 MCG/ACT nasal spray 2 spray  2 spray Each Nare Daily PRN Etta Quill, DO       furosemide (LASIX) tablet 20 mg  20 mg Oral Daily Alcario Drought, Jared M, DO       gabapentin (NEURONTIN) tablet 800 mg  800 mg  Oral TID Etta Quill, DO       loratadine (CLARITIN) tablet 10 mg  10 mg Oral Daily PRN Etta Quill, DO       lubiprostone (AMITIZA) capsule 24 mcg  24 mcg Oral BID PRN Etta Quill, DO       mometasone-formoterol (DULERA) 200-5 MCG/ACT inhaler 2 puff  2 puff Inhalation BID Jennette Kettle M, DO       ondansetron [facility]) tablet 4 mg  4 mg Oral Q6H PRN Etta Quill, DO       Or   ondansetron [facility]) injection 4 mg  4 mg Intravenous Q6H PRN Etta Quill, DO       oxyCODONE (Oxy IR/ROXICODONE) immediate release tablet 15 mg  15 mg Oral QID PRN Etta Quill, DO       pantoprazole (PROTONIX) EC tablet 80 mg  80 mg Oral Daily Alcario Drought, Jared M, DO       sertraline (ZOLOFT) tablet 100 mg  100 mg Oral Daily Alcario Drought, Jared M, DO       traZODone (DESYREL) tablet 50 mg  50 mg Oral QHS PRN Etta Quill, DO       Current Outpatient Medications  Medication Sig Dispense Refill   acetaminophen (TYLENOL 8 HOUR) 650 MG CR tablet Take 1 tablet (650 mg total) by mouth every 8 (eight) hours as needed for pain. 30 tablet 0   ADVAIR DISKUS 250-50 MCG/DOSE AEPB Inhale 1 puff into the lungs in the morning and at bedtime.  3   albuterol (PROVENTIL HFA;VENTOLIN HFA) 108 (90 BASE) MCG/ACT inhaler Inhale 2 puffs into the lungs every 4 (four) hours as needed for wheezing or  shortness of breath. 1 Inhaler 0   AMITIZA 24 MCG capsule Take 24 mcg by mouth 2 (two) times daily as needed for constipation.  6   fluticasone (FLONASE) 50 MCG/ACT nasal spray Place 2 sprays into both nostrils daily. (Patient taking differently: Place 2 sprays into both nostrils daily as needed for allergies.) 16 g 2   furosemide (LASIX) 20 MG tablet Take 20 mg by mouth daily.     gabapentin (NEURONTIN) 800 MG tablet Take 800 mg by mouth 3 (three) times daily.  3   loratadine (CLARITIN) 10 MG tablet Take 10 mg by mouth daily as needed for allergies.     naloxone (NARCAN) nasal spray 4 mg/0.1 mL Place 1 spray into the nose as needed (accidental overdose).     naproxen (NAPROSYN) 500 MG tablet Take 500 mg by mouth 2 (two) times daily.     omeprazole (PRILOSEC) 40 MG capsule Take 40 mg by mouth daily.  3   ondansetron (ZOFRAN ODT) 4 MG disintegrating tablet Take 1 tablet (4 mg total) by mouth every 8 (eight) hours as needed for nausea or vomiting. 20 tablet 0   oxyCODONE (ROXICODONE) 15 MG immediate release tablet Take 1 tablet (15 mg total) by mouth 4 (four) times daily as needed for pain. 30 tablet 0   phentermine (ADIPEX-P) 37.5 MG tablet Take 37.5 mg by mouth daily.     sertraline (ZOLOFT) 100 MG tablet Take 100 mg by mouth daily.  3   traZODone (DESYREL) 50 MG tablet Take 50 mg by mouth at bedtime as needed for sleep.      TRULICITY 1.5 0000000 SOPN Inject 1.5 mg into the skin every Friday.     Vitamin D, Ergocalciferol, (DRISDOL) 50000 units CAPS capsule Take 50,000 Units by mouth every Sunday.  5   albuterol (PROVENTIL) (2.5 MG/3ML) 0.083% nebulizer solution Take 3 mLs (2.5 mg total) by nebulization every 4 (four) hours as needed for wheezing. (Patient not taking: Reported on 12/01/2021) 75 mL 0    Allergies as of 12/01/2021 - Review Complete 12/01/2021  Allergen Reaction Noted   Penicillins Itching 12/01/2021    Review of Systems:    Constitutional: No weight loss, fever, chills,  weakness or fatigue HEENT: Eyes: No change in vision               Ears, Nose, Throat:  No change in hearing or congestion Skin: No rash or itching Cardiovascular: No chest pain, chest pressure or palpitations   Respiratory: No SOB or cough Gastrointestinal: See HPI and otherwise negative Genitourinary: No dysuria or change in urinary frequency Neurological: No headache, dizziness or syncope Musculoskeletal: No new muscle or joint pain Hematologic: No bleeding or bruising Psychiatric: No history of depression or anxiety     Physical Exam:  Vital signs in last 24 hours: Temp:  [98.1 F (36.7 C)-98.4 F (36.9 C)] 98.4 F (36.9 C) (01/19 0737) Pulse Rate:  [72-85] 83 (01/19 0737) Resp:  [13-20] 18 (01/19 0737) BP: (124-169)/(65-138) 169/93 (01/19 0737) SpO2:  [98 %-100 %] 98 % (01/19 0737)    General:   Pleasant, well developed female in no acute distress Head:  Normocephalic and atraumatic. Eyes: sclerae anicteric,conjunctive pale  Heart:  regular rate and rhythm, no murmurs or gallops Pulm: Clear anteriorly; no wheezing Abdomen:  Soft, Obese AB,  Normal bowel sounds. moderate tenderness in the epigastrium with 3-4 cm mass/hardness,With guarding and Without rebound, hepatomegaly noted. Extremities:  With edema bilateral legs, no erythema Msk:  Symmetrical without gross deformities. Peripheral pulses intact.  Neurologic:  Alert and  oriented x4;  grossly normal neurologically. Skin:   rash along right anterior chest and slightly on left, see picture in chart Psychiatric: Demonstrates good judgement and reason without abnormal affect or behaviors.  LAB RESULTS: Recent Labs    12/01/21 0136  WBC 5.8  HGB 5.7*  HCT 21.3*  PLT 338   BMET Recent Labs    12/01/21 0136  NA 138  K 3.5  CL 106  CO2 23  GLUCOSE 91  BUN <5*  CREATININE 0.57  CALCIUM 8.9   LFT Recent Labs    12/01/21 0136  PROT 7.1  ALBUMIN 3.2*  AST 19  ALT 11  ALKPHOS 67  BILITOT 0.1*    PT/INR No results for input(s): LABPROT, INR in the last 72 hours.  STUDIES: DG Chest 2 View  Result Date: 12/01/2021 CLINICAL DATA:  Shortness of breath and recent choking episode EXAM: CHEST - 2 VIEW COMPARISON:  02/27/2017 FINDINGS: The heart size and mediastinal contours are within normal limits. Both lungs are clear. The visualized skeletal structures are unremarkable. IMPRESSION: No active cardiopulmonary disease. Electronically Signed   By: Inez Catalina M.D.   On: 12/01/2021 03:35     Vladimir Crofts  12/01/2021, 8:04 AM

## 2021-12-01 NOTE — Assessment & Plan Note (Addendum)
For now, patient is on clear liquids.  Sliding scale.  Will check A1c.  Holding Trulicity.

## 2021-12-01 NOTE — Assessment & Plan Note (Signed)
Patient states that this has been going on now for over a month since she had a bad fall.  Pain is described right above the left knee going all the way down to the foot.  She says that she has had sciatica before and this is different.  It is tender on both the anterior and posterior aspects of the knee and leg.  Have ordered lower extremity Doppler.  Interestingly, looks like she had a referral for bilateral leg pain to vascular surgery, so Dopplers unremarkable, consider ABIs.

## 2021-12-01 NOTE — Assessment & Plan Note (Signed)
Certainly concerning for worsening dysphagia over the past few months.  Will be seen by GI and I suspect she will certainly need endoscopy

## 2021-12-01 NOTE — ED Notes (Signed)
Admitting notifed that pt reports having difficulty swallowing pills, different route requested

## 2021-12-01 NOTE — Assessment & Plan Note (Signed)
Meets criteria with BMI greater than 30 ?

## 2021-12-01 NOTE — Assessment & Plan Note (Addendum)
Unclear etiology what caused this.  Patient has had successful transfusions in the past.  Hematology to see

## 2021-12-01 NOTE — H&P (View-Only) (Signed)
Consultation  Referring Provider:     Dr. Gevena Barre Primary Care Physician:  Iraan Primary Gastroenterologist:    [facility] versus [facility]     Reason for Consultation:    Anemia, dysphagia    Attending physician's note  I have taken a history, reviewed the chart and examined the patient. I performed a substantive portion of this encounter, including complete performance of at least one of the key components, in conjunction with the APP. I agree with the APP's note, impression and recommendations.    51 year old Spanish-speaking female admitted with severe symptomatic anemia and melena Hemoglobin 5.7 on admission, developed reaction to PRBC transfusion which was stopped.  Repeat H&H stable  Anemia likely subacute GI blood loss, no sign of active GI hemorrhage Differential includes possible gastroduodenal ulcer due to NSAID use and will also need to exclude H. pylori gastritis  We will continue to monitor Continue IV PPI   Consider IV Feraheme X2 to improve iron deficiency and may potentially reduce transfusion requirement  Will defer to primary team regarding PRBC transfusion  We will plan for EGD for evaluation and therapeutic intervention if needed once hemoglobin is>7 so patient can safely undergo the procedure with no complications associated with anesthesia or the procedure  If EGD unrevealing for source of GI bleed, may need colonoscopy for further evaluation during this hospitalization otherwise will defer surveillance colonoscopy to outpatient  The patient was provided an opportunity to ask questions and all were answered. The patient agreed with the plan and demonstrated an understanding of the instructions.  Damaris Hippo , MD [phone]     Impression    Melena without hemodynamic compromise in setting on NSAID use HGB 5.7 MCV 69.4 Platelets 338 BUN <5 Cr 0.57 No elevation of BUN, appears to be more chronic rather than  acute  Chronic iron def anemia Possible colon/EGD with dil in 2015 Baseline in 2021 appears to be 9-10, has had chronic anemia x 2017 by our records Has had transfusions in the past  Transfusion reaction Being worked up by primary  Hypertension, essential, benign Monitor in patient  Diabetes 2 On trulicity outpatient- can worse GERD  Chronic pain on opioids with opioid induced constipation On amitiza outpatient  Right leg pain Patient had right leg pain s/p fall 2 months ago, posterior knee pain Will let primary team know  Rash on chest Itching, pain x 2-3 days Last MGM 2020, get CT AB   Plan   -Protonix 40 mg IV BID. -Clear liquid diet, NPO at midnight. -Will ideally want HGB above 7 for EGD, primary team is working this up, consider hematology consult, iron infusions. -Plan for EGD potentially tomorrow or when hemoglobin allows, in the meantime will schedule for CT abdomen and pelvis with contrast due to abdominal discomfort, upper abdominal mass, seroma, mesh. - I thoroughly discussed the procedure to include nature, alternatives, benefits, and risks including but not limited to bleeding, perforation, infection, anesthesia/cardiac and pulmonary complications. Patient provides understanding and gave verbal consent to proceed. -Would give 10 mg IV Reglan 30 minutes prior to endoscopy due to trulicity use --If EGD is negative then will consider colonoscopy this visit, otherwise she is due for colonoscopy and may need 2 day prep with opioid use.  -Please obtain early AM CBC, CMET          HPI:   [name] is a 51 y.o. Spanish-speaking female with medical history significant for diabetes type  II, hypertension, chronic pain with opioid use, constipation secondary to opioid use, iron deficiency anemia, patient denies history of transfusions in the past. °Patient had tubal ligation and history of recurrent laparoscopic hernia repair with recurrence 2017 with mesh, continue  to have pain had CT March 2021 that showed hernia repair with bulging of mesh and some small bowel in this area per note with general surgery. ° °Patient is naproxen 500 mg twice daily, Trulicity 1.5 mg °Looking through records patient may have also had  endoscopy 2015 at [facility] for stricture s/p dilatation and colonoscopy, unable to see these reports.  Per patient she is due 2020 for repeat colonoscopy however this was delayed due to COVID. ° °Patient had hemoglobin 5.8 primary care 11/01/2021. °Presented to the ER 12/01/2021 with choking episode and dyspnea. °Patient states she has had progressive dysphagia over 6 months, has been on Prilosec daily outpatient since her peptic ulcer in 2015.  Patient has issues with pills, progressing to food, denies any choking or issues with liquids. °Having epigastric abdominal discomfort feels like her ulcer is back per patient, states milk helps the pain. °Describes progressive dysphagia over the last several months, with worsening dysphagia and odynophagia overnight after choking episode.  She also has been having progressive shortness of breath shortness of breath, weakness. °Also recounts a fall 2 months ago on left leg with continuing posterior knee pain and swelling.  Denies erythema. °Has bowel movement 1-2 times daily, last time was 7 PM yesterday, states bowel movements have been more green, denies melena or hematochezia.  Does have some lower abdominal burning discomfort with bowel movement. °Patient is on anti-inflammatories chronically, naproxen 500 mg twice daily, denies alcohol or smoking history. °Patient did take iron a year ago but has been off iron for the past 6 to 12 months. ° °Patient had 160 pound weight loss in 2 years in preparation for hernia repair. ° °ED course: Potassium 3.5, BUN 5, creatinine 0.57, albumin 3.2, normal AST and ALT, total bilirubin low. °Iron 14, TIBC 501, saturation 3, ferritin 4, B12 354. °Hemoglobin 5.7 in the ER with microcytosis  69.4, platelets 338, white blood cell count 5.8.  °Pending COVID and flu panel °Patient started 1 unit packed red blood cells but began to have swelling so this was stopped. °Normal chest x-ray. ° °Past Medical History:  °Diagnosis Date  ° Asthma   ° Back pain   ° Diabetes mellitus without complication (HCC)   ° Hypertension   ° ° °Surgical History:  °She  has a past surgical history that includes Finger surgery (2011); Tubal ligation (2004); Hernia repair; and Incision and drainage abscess (Left, 05/06/2017). °Family History:  °Her family history includes Heart attack in her father; Kidney failure in her mother. °Social History:  ° reports that she has never smoked. She has never used smokeless tobacco. She reports that she does not drink alcohol and does not use drugs. ° °Prior to Admission medications   °Medication Sig Start Date End Date Taking? Authorizing Provider  °acetaminophen (TYLENOL 8 HOUR) 650 MG CR tablet Take 1 tablet (650 mg total) by mouth every 8 (eight) hours as needed for pain. 02/05/20  Yes Aberman, Caroline C, PA-C  °ADVAIR DISKUS 250-50 MCG/DOSE AEPB Inhale 1 puff into the lungs in the morning and at bedtime. 08/25/16  Yes [provider]  °albuterol (PROVENTIL HFA;VENTOLIN HFA) 108 (90 BASE) MCG/ACT inhaler Inhale 2 puffs into the lungs every 4 (four) hours as needed for wheezing or shortness of   breath. 10/06/13  Yes Carvel Getting, NP  AMITIZA 24 MCG capsule Take 24 mcg by mouth 2 (two) times daily as needed for constipation. 08/25/16  Yes [provider]  fluticasone (FLONASE) 50 MCG/ACT nasal spray Place 2 sprays into both nostrils daily. Patient taking differently: Place 2 sprays into both nostrils daily as needed for allergies. 10/06/13  Yes Carvel Getting, NP  furosemide (LASIX) 20 MG tablet Take 20 mg by mouth daily. 09/16/21  Yes [provider]  gabapentin (NEURONTIN) 800 MG tablet Take 800 mg by mouth 3 (three) times daily. 08/25/16  Yes [provider]  loratadine (CLARITIN) 10 MG tablet Take 10 mg by mouth daily as needed for allergies. 09/15/21  Yes [provider]  naloxone (NARCAN) nasal spray 4 mg/0.1 mL Place 1 spray into the nose as needed (accidental overdose).   Yes [provider]  naproxen (NAPROSYN) 500 MG tablet Take 500 mg by mouth 2 (two) times daily. 11/08/21  Yes [provider]  omeprazole (PRILOSEC) 40 MG capsule Take 40 mg by mouth daily. 08/25/16  Yes [provider]  ondansetron (ZOFRAN ODT) 4 MG disintegrating tablet Take 1 tablet (4 mg total) by mouth every 8 (eight) hours as needed for nausea or vomiting. 02/05/20  Yes Aberman, Druscilla Brownie, PA-C  oxyCODONE (ROXICODONE) 15 MG immediate release tablet Take 1 tablet (15 mg total) by mouth 4 (four) times daily as needed for pain. 05/11/17  Yes Anyanwu, Sallyanne Havers, MD  phentermine (ADIPEX-P) 37.5 MG tablet Take 37.5 mg by mouth daily. 10/13/21  Yes [provider]  sertraline (ZOLOFT) 100 MG tablet Take 100 mg by mouth daily. 03/12/17  Yes [provider]  traZODone (DESYREL) 50 MG tablet Take 50 mg by mouth at bedtime as needed for sleep.    Yes [provider]  TRULICITY 1.5 0000000 SOPN Inject 1.5 mg into the skin every Friday. 11/08/21  Yes [provider]  Vitamin D, Ergocalciferol, (DRISDOL) 50000 units CAPS capsule Take 50,000 Units by mouth every Sunday. 08/25/16  Yes [provider]  albuterol (PROVENTIL) (2.5 MG/3ML) 0.083% nebulizer solution Take 3 mLs (2.5 mg total) by nebulization every 4 (four) hours as needed for wheezing. Patient not taking: Reported on 12/01/2021 09/10/13   Lucila Maine, PA-C    Current Facility-Administered Medications  Medication Dose Route Frequency Provider Last Rate Last Admin   0.9 %  sodium chloride infusion  10 mL/hr Intravenous Once Mesner, Corene Cornea, MD       acetaminophen (TYLENOL) tablet 650 mg  650 mg Oral Q6H PRN Etta Quill, DO        Or   acetaminophen (TYLENOL) suppository 650 mg  650 mg Rectal Q6H PRN Etta Quill, DO       albuterol (PROVENTIL) (2.5 MG/3ML) 0.083% nebulizer solution 2.5 mg  2.5 mg Inhalation Q4H PRN Etta Quill, DO       [START ON 12/02/2021] Dulaglutide SOPN 1.5 mg  1.5 mg Subcutaneous Q Fri Gardner, Jared M, DO       ferrous sulfate tablet 325 mg  325 mg Oral TID WC Etta Quill, DO       fluticasone (FLONASE) 50 MCG/ACT nasal spray 2 spray  2 spray Each Nare Daily PRN Etta Quill, DO       furosemide (LASIX) tablet 20 mg  20 mg Oral Daily Alcario Drought, Jared M, DO       gabapentin (NEURONTIN) tablet 800 mg  800 mg  Oral TID Etta Quill, DO       loratadine (CLARITIN) tablet 10 mg  10 mg Oral Daily PRN Etta Quill, DO       lubiprostone (AMITIZA) capsule 24 mcg  24 mcg Oral BID PRN Etta Quill, DO       mometasone-formoterol (DULERA) 200-5 MCG/ACT inhaler 2 puff  2 puff Inhalation BID Jennette Kettle M, DO       ondansetron [facility]) tablet 4 mg  4 mg Oral Q6H PRN Etta Quill, DO       Or   ondansetron [facility]) injection 4 mg  4 mg Intravenous Q6H PRN Etta Quill, DO       oxyCODONE (Oxy IR/ROXICODONE) immediate release tablet 15 mg  15 mg Oral QID PRN Etta Quill, DO       pantoprazole (PROTONIX) EC tablet 80 mg  80 mg Oral Daily Alcario Drought, Jared M, DO       sertraline (ZOLOFT) tablet 100 mg  100 mg Oral Daily Alcario Drought, Jared M, DO       traZODone (DESYREL) tablet 50 mg  50 mg Oral QHS PRN Etta Quill, DO       Current Outpatient Medications  Medication Sig Dispense Refill   acetaminophen (TYLENOL 8 HOUR) 650 MG CR tablet Take 1 tablet (650 mg total) by mouth every 8 (eight) hours as needed for pain. 30 tablet 0   ADVAIR DISKUS 250-50 MCG/DOSE AEPB Inhale 1 puff into the lungs in the morning and at bedtime.  3   albuterol (PROVENTIL HFA;VENTOLIN HFA) 108 (90 BASE) MCG/ACT inhaler Inhale 2 puffs into the lungs every 4 (four) hours as needed for wheezing or  shortness of breath. 1 Inhaler 0   AMITIZA 24 MCG capsule Take 24 mcg by mouth 2 (two) times daily as needed for constipation.  6   fluticasone (FLONASE) 50 MCG/ACT nasal spray Place 2 sprays into both nostrils daily. (Patient taking differently: Place 2 sprays into both nostrils daily as needed for allergies.) 16 g 2   furosemide (LASIX) 20 MG tablet Take 20 mg by mouth daily.     gabapentin (NEURONTIN) 800 MG tablet Take 800 mg by mouth 3 (three) times daily.  3   loratadine (CLARITIN) 10 MG tablet Take 10 mg by mouth daily as needed for allergies.     naloxone (NARCAN) nasal spray 4 mg/0.1 mL Place 1 spray into the nose as needed (accidental overdose).     naproxen (NAPROSYN) 500 MG tablet Take 500 mg by mouth 2 (two) times daily.     omeprazole (PRILOSEC) 40 MG capsule Take 40 mg by mouth daily.  3   ondansetron (ZOFRAN ODT) 4 MG disintegrating tablet Take 1 tablet (4 mg total) by mouth every 8 (eight) hours as needed for nausea or vomiting. 20 tablet 0   oxyCODONE (ROXICODONE) 15 MG immediate release tablet Take 1 tablet (15 mg total) by mouth 4 (four) times daily as needed for pain. 30 tablet 0   phentermine (ADIPEX-P) 37.5 MG tablet Take 37.5 mg by mouth daily.     sertraline (ZOLOFT) 100 MG tablet Take 100 mg by mouth daily.  3   traZODone (DESYREL) 50 MG tablet Take 50 mg by mouth at bedtime as needed for sleep.      TRULICITY 1.5 0000000 SOPN Inject 1.5 mg into the skin every Friday.     Vitamin D, Ergocalciferol, (DRISDOL) 50000 units CAPS capsule Take 50,000 Units by mouth every Sunday.  5   albuterol (PROVENTIL) (2.5 MG/3ML) 0.083% nebulizer solution Take 3 mLs (2.5 mg total) by nebulization every 4 (four) hours as needed for wheezing. (Patient not taking: Reported on 12/01/2021) 75 mL 0    Allergies as of 12/01/2021 - Review Complete 12/01/2021  Allergen Reaction Noted   Penicillins Itching 12/01/2021    Review of Systems:    Constitutional: No weight loss, fever, chills,  weakness or fatigue HEENT: Eyes: No change in vision               Ears, Nose, Throat:  No change in hearing or congestion Skin: No rash or itching Cardiovascular: No chest pain, chest pressure or palpitations   Respiratory: No SOB or cough Gastrointestinal: See HPI and otherwise negative Genitourinary: No dysuria or change in urinary frequency Neurological: No headache, dizziness or syncope Musculoskeletal: No new muscle or joint pain Hematologic: No bleeding or bruising Psychiatric: No history of depression or anxiety     Physical Exam:  Vital signs in last 24 hours: Temp:  [98.1 F (36.7 C)-98.4 F (36.9 C)] 98.4 F (36.9 C) (01/19 0737) Pulse Rate:  [72-85] 83 (01/19 0737) Resp:  [13-20] 18 (01/19 0737) BP: (124-169)/(65-138) 169/93 (01/19 0737) SpO2:  [98 %-100 %] 98 % (01/19 0737)    General:   Pleasant, well developed female in no acute distress Head:  Normocephalic and atraumatic. Eyes: sclerae anicteric,conjunctive pale  Heart:  regular rate and rhythm, no murmurs or gallops Pulm: Clear anteriorly; no wheezing Abdomen:  Soft, Obese AB,  Normal bowel sounds. moderate tenderness in the epigastrium with 3-4 cm mass/hardness,With guarding and Without rebound, hepatomegaly noted. Extremities:  With edema bilateral legs, no erythema Msk:  Symmetrical without gross deformities. Peripheral pulses intact.  Neurologic:  Alert and  oriented x4;  grossly normal neurologically. Skin:   rash along right anterior chest and slightly on left, see picture in chart Psychiatric: Demonstrates good judgement and reason without abnormal affect or behaviors.  LAB RESULTS: Recent Labs    12/01/21 0136  WBC 5.8  HGB 5.7*  HCT 21.3*  PLT 338   BMET Recent Labs    12/01/21 0136  NA 138  K 3.5  CL 106  CO2 23  GLUCOSE 91  BUN <5*  CREATININE 0.57  CALCIUM 8.9   LFT Recent Labs    12/01/21 0136  PROT 7.1  ALBUMIN 3.2*  AST 19  ALT 11  ALKPHOS 67  BILITOT 0.1*    PT/INR No results for input(s): LABPROT, INR in the last 72 hours.  STUDIES: DG Chest 2 View  Result Date: 12/01/2021 CLINICAL DATA:  Shortness of breath and recent choking episode EXAM: CHEST - 2 VIEW COMPARISON:  02/27/2017 FINDINGS: The heart size and mediastinal contours are within normal limits. Both lungs are clear. The visualized skeletal structures are unremarkable. IMPRESSION: No active cardiopulmonary disease. Electronically Signed   By: Inez Catalina M.D.   On: 12/01/2021 03:35     Vladimir Crofts  12/01/2021, 8:04 AM

## 2021-12-01 NOTE — Progress Notes (Addendum)
[facility] Progress Note  Patient: [name]    [id number]  DOA: 12/01/2021    Date of Service: the patient was seen and examined on 12/01/2021  Brief hospital course: 51 year old female with past medical history of diabetes mellitus type 2, hypertension plus previous history of esophageal stricture requiring dilatation presented to the emergency room on the early morning of 1/19 after choking episode with complaints of several months of progressively worsening dysphagia and odynophagia.  She also had a nosebleed which made her worried that her low hemoglobin because she has felt short of breath since that time.  In the emergency room, patient noted to have hemoglobin of 5.8 with an MCV of 67 and stable vitals.  Patient admitted to the hospitalist service and gastroenterology consulted.  Patient reportedly was first diagnosed with low blood last summer, but has never gotten a transfusion.  Upon admission, patient was ordered transfusion of 2 unit packed red blood cells, however upon initiation of transfusion, patient started developing some facial swelling concerning for transfusion reaction and transfusion held.  Seen by GI who plans to do EGD and colonoscopy.  Interestingly, follow-up hemoglobin notes increase although patient did not receive much blood   Assessment and Plan: Cardiovascular and Mediastinum Hypertension, essential, benign Assessment & Plan Continue home medications.  Blood pressure is elevated.  A reassuring sign that she is not having active bleeding.  Digestive Dysphagia Assessment & Plan Certainly concerning for worsening dysphagia over the past few months.  Will be seen by GI and I suspect she will certainly need endoscopy  Endocrine Diabetes type 2, controlled [facility]) Assessment & Plan For now, patient is on clear liquids.  Sliding scale.  Will check A1c.  Holding Trulicity.  Other Abnormal abdominal CT scan Assessment & Plan She had been  complaining of some midepigastric abdominal pain with the area being quite tender.  She has had previous hernia surgery x2 with mesh present and plans for a third surgery, according to family so CT scan of abdomen ordered.  Patient found to have small ring-enhancing lesion felt to be fluid collection.  Husband thought this might be related to where she injects herself with Trulicity.  We will monitor, patient does not look septic or this is from abscess.  If nothing else, patient needs to follow-up with her surgeon at [facility]  Left leg pain Assessment & Plan Patient states that this has been going on now for over a month since she had a bad fall.  Pain is described right above the left knee going all the way down to the foot.  She says that she has had sciatica before and this is different.  It is tender on both the anterior and posterior aspects of the knee and leg.  Have ordered lower extremity Doppler.  Interestingly, looks like she had a referral for bilateral leg pain to vascular surgery, so Dopplers unremarkable, consider ABIs.  Obesity (BMI 30-39.9) Assessment & Plan Meets criteria with BMI greater than 30  Depression Assessment & Plan Continue home medications  Chronic pain Assessment & Plan Continue home medications plus bowel regimen  Transfusion reaction Assessment & Plan Unclear etiology what caused this.  Patient has had successful transfusions in the past.  Hematology to see  History of esophageal stricture Assessment & Plan As discussed below.  * Iron deficiency anemia Assessment & Plan Had extensive discussion with patient and her family with use of a translator.  Patient is postmenopausal and no longer has periods.  She has been seeing a doctor regularly at [facility].  They told her that she had a very low hemoglobin last summer and when lab work came back few days later, her family took her to the emergency room at [facility].  After waiting for 6 hours and not  being seen, she then went to an urgent care where labs were reportedly checked and found to be unremarkable.  In review of her records, it looks like she had a referral for hematology at [facility] in August but did not go.  In looking at previous labs, her hemoglobin has run low in the past, but not as low as 5.7.  That said, interestingly, repeat lab work this morning noted hemoglobin of seven-point one of the patient had very little blood due to transfusion reaction.  Will consult hematology.  GI plans to take for EGD and colonoscopy.    Body mass index is 33.11 kg/m.        Consultants: Gastroenterology Hematology  Procedures: Attempted transfusion of 1 unit packed red blood cells.  Stopped after reaction developed  Antimicrobials: None  Code Status: Full code  Communicated through translator Subjective: Patient complains of pain in left leg as well as midepigastric pain  Objective: Vital signs were reviewed and unremarkable. Vitals:   12/01/21 1821 12/01/21 2042  BP: (!) 149/73   Pulse: 81 80  Resp: 17 16  Temp: 98.4 F (36.9 C)   SpO2: 98% 97%    Intake/Output Summary (Last 24 hours) at 12/01/2021 2137 Last data filed at 12/01/2021 1700 Gross per 24 hour  Intake 914 ml  Output --  Net 914 ml   Filed Weights   12/01/21 1108  Weight: 76.9 kg   Body mass index is 33.11 kg/m.  Exam:  General: Alert and oriented x3, mild to moderate distress secondary to leg and abdominal pain HEENT: Normocephalic and atraumatic, mucous membranes are slightly dry Cardiovascular: Regular rate and rhythm, S1-S2 Respiratory: Clear to auscultation bilaterally Abdomen: Soft, moderate tenderness in the midepigastric area radiating down to the rest of the generalized abdominal area Musculoskeletal: No clubbing or cyanosis, trace pitting edema.  Her left leg is tender in the anterior and posterior aspects of her left knee and down her left leg Skin: Patient has numerous 1 to 1.5 cm  circular pruritic lesions on her upper chest Psychiatry: Appropriate, no evidence of psychoses Neurology: No focal deficits  Data Reviewed: Noted critically low hemoglobin of 5.7 which in the follow-up had increased to 7.1.  Low MCV  Disposition:  Status is: Inpatient  Remains inpatient appropriate because: Needs for colonoscopy and EGD and further evaluation for possible bleeding versus other cause for severe anemia   Family Communication: Husband and daughters at the bedside DVT Prophylaxis: SCDs Start: 12/01/21 0340    Author: Annita Brod ,MD 12/01/2021 9:37 PM  To reach On-call, see care teams to locate the attending and reach out via [email or web address]. Between 7PM-7AM, please contact night-coverage If you still have difficulty reaching the attending provider, please page the [facility] (Director on Call) for [facility] on amion for assistance.

## 2021-12-01 NOTE — ED Notes (Signed)
Pt transport to XR 

## 2021-12-02 ENCOUNTER — Encounter (HOSPITAL_COMMUNITY): Payer: Self-pay | Admitting: Internal Medicine

## 2021-12-02 ENCOUNTER — Encounter (HOSPITAL_COMMUNITY)
Admission: EM | Disposition: A | Discharge status: Home / Self Care | Payer: Self-pay | Source: Home / Self Care | Attending: Internal Medicine

## 2021-12-02 ENCOUNTER — Inpatient Hospital Stay (HOSPITAL_COMMUNITY): Payer: 59 | Admitting: Anesthesiology

## 2021-12-02 ENCOUNTER — Inpatient Hospital Stay (HOSPITAL_COMMUNITY): Payer: 59

## 2021-12-02 DIAGNOSIS — M79605 Pain in left leg: Secondary | ICD-10-CM

## 2021-12-02 DIAGNOSIS — E669 Obesity, unspecified: Secondary | ICD-10-CM

## 2021-12-02 DIAGNOSIS — R935 Abnormal findings on diagnostic imaging of other abdominal regions, including retroperitoneum: Secondary | ICD-10-CM

## 2021-12-02 DIAGNOSIS — B3781 Candidal esophagitis: Principal | ICD-10-CM

## 2021-12-02 DIAGNOSIS — K922 Gastrointestinal hemorrhage, unspecified: Secondary | ICD-10-CM

## 2021-12-02 HISTORY — PX: ESOPHAGOGASTRODUODENOSCOPY (EGD) WITH PROPOFOL: SHX5813

## 2021-12-02 HISTORY — PX: BIOPSY: SHX5522

## 2021-12-02 LAB — COMPREHENSIVE METABOLIC PANEL
ALT: 10 U/L (ref 0–44)
AST: 16 U/L (ref 15–41)
Albumin: 3 g/dL — ABNORMAL LOW (ref 3.5–5.0)
Alkaline Phosphatase: 65 U/L (ref 38–126)
Anion gap: 7 (ref 5–15)
BUN: <5 mg/dL — ABNORMAL LOW (ref 6–20)
CO2: 26 mmol/L (ref 22–32)
Calcium: 9 mg/dL (ref 8.9–10.3)
Chloride: 106 mmol/L (ref 98–111)
Creatinine, Ser: 0.66 mg/dL (ref 0.44–1.00)
GFR, Estimated: >60 mL/min (ref >60)
Glucose, Bld: 87 mg/dL (ref 70–99)
Potassium: 3.6 mmol/L (ref 3.5–5.1)
Sodium: 139 mmol/L (ref 135–145)
Total Bilirubin: <0.1 mg/dL — ABNORMAL LOW (ref 0.3–1.2)
Total Protein: 6.7 g/dL (ref 6.5–8.1)

## 2021-12-02 LAB — CBC WITH DIFFERENTIAL/PLATELET
Abs Immature Granulocytes: 0.02 10*3/uL (ref 0.00–0.07)
Basophils Absolute: 0 10*3/uL (ref 0.0–0.1)
Basophils Relative: 1 %
Eosinophils Absolute: 0.2 10*3/uL (ref 0.0–0.5)
Eosinophils Relative: 3 %
HCT: 24.9 % — ABNORMAL LOW (ref 36.0–46.0)
Hemoglobin: 7 g/dL — ABNORMAL LOW (ref 12.0–15.0)
Immature Granulocytes: 0 %
Lymphocytes Relative: 47 %
Lymphs Abs: 2.7 10*3/uL (ref 0.7–4.0)
MCH: 19.7 pg — ABNORMAL LOW (ref 26.0–34.0)
MCHC: 28.1 g/dL — ABNORMAL LOW (ref 30.0–36.0)
MCV: 69.9 fL — ABNORMAL LOW (ref 80.0–100.0)
Monocytes Absolute: 0.5 10*3/uL (ref 0.1–1.0)
Monocytes Relative: 9 %
Neutro Abs: 2.4 10*3/uL (ref 1.7–7.7)
Neutrophils Relative %: 40 %
Platelets: 371 10*3/uL (ref 150–400)
RBC: 3.56 MIL/uL — ABNORMAL LOW (ref 3.87–5.11)
RDW: 22.2 % — ABNORMAL HIGH (ref 11.5–15.5)
WBC: 5.8 10*3/uL (ref 4.0–10.5)
nRBC: 0.5 % — ABNORMAL HIGH (ref 0.0–0.2)

## 2021-12-02 LAB — MRSA NEXT GEN BY PCR, NASAL: MRSA by PCR Next Gen: NOT DETECTED

## 2021-12-02 LAB — TRANSFUSION REACTION
DAT C3: NEGATIVE
Post RXN DAT IgG: NEGATIVE

## 2021-12-02 LAB — GLUCOSE, CAPILLARY: Glucose-Capillary: 101 mg/dL — ABNORMAL HIGH (ref 70–99)

## 2021-12-02 SURGERY — ESOPHAGOGASTRODUODENOSCOPY (EGD) WITH PROPOFOL
Anesthesia: Monitor Anesthesia Care

## 2021-12-02 MED ORDER — NYSTATIN 100000 UNIT/ML MT SUSP
5.0000 mL | Freq: Four times a day (QID) | OROMUCOSAL | Status: AC
  Administered 2021-12-02 – 2021-12-03 (×4): 500000 [IU] via ORAL
  Filled 2021-12-02 (×5): qty 5

## 2021-12-02 MED ORDER — SODIUM CHLORIDE 0.9 % IV SOLN: INTRAVENOUS | Status: DC

## 2021-12-02 MED ORDER — LIDOCAINE 2% (20 MG/ML) 5 ML SYRINGE
INTRAMUSCULAR | Status: DC | PRN
  Administered 2021-12-02: 60 mg via INTRAVENOUS

## 2021-12-02 MED ORDER — MAGIC MOUTHWASH W/LIDOCAINE
10.0000 mL | Freq: Three times a day (TID) | ORAL | Status: DC | PRN
  Administered 2021-12-02 – 2021-12-03 (×2): 10 mL via ORAL
  Filled 2021-12-02 (×3): qty 10

## 2021-12-02 MED ORDER — PROPOFOL 500 MG/50ML IV EMUL
INTRAVENOUS | Status: DC | PRN
  Administered 2021-12-02: 100 ug/kg/min via INTRAVENOUS

## 2021-12-02 MED ORDER — ENSURE MAX PROTEIN PO LIQD
11.0000 [oz_av] | Freq: Two times a day (BID) | ORAL | Status: DC
  Administered 2021-12-02 – 2021-12-04 (×3): 11 [oz_av] via ORAL
  Filled 2021-12-02 (×6): qty 330

## 2021-12-02 MED ORDER — PANTOPRAZOLE SODIUM 40 MG PO TBEC
40.0000 mg | DELAYED_RELEASE_TABLET | Freq: Every day | ORAL | Status: DC
  Administered 2021-12-02 – 2021-12-04 (×3): 40 mg via ORAL
  Filled 2021-12-02 (×3): qty 1

## 2021-12-02 MED ORDER — LACTATED RINGERS IV SOLN: INTRAVENOUS | Status: DC | PRN

## 2021-12-02 MED ORDER — ADULT MULTIVITAMIN W/MINERALS CH
1.0000 | ORAL_TABLET | Freq: Every day | ORAL | Status: DC
  Administered 2021-12-02 – 2021-12-04 (×3): 1 via ORAL
  Filled 2021-12-02 (×3): qty 1

## 2021-12-02 MED ORDER — PROPOFOL 10 MG/ML IV BOLUS
INTRAVENOUS | Status: DC | PRN
  Administered 2021-12-02 (×3): 10 mg via INTRAVENOUS

## 2021-12-02 SURGICAL SUPPLY — 15 items

## 2021-12-02 NOTE — Progress Notes (Signed)
[facility] Progress Note  Patient: [name]    [id number]  DOA: 12/01/2021    Date of Service: the patient was seen and examined on 12/02/2021  Brief hospital course: 51 year old female with past medical history of diabetes mellitus type 2, hypertension plus previous history of esophageal stricture requiring dilatation presented to the emergency room on the early morning of 1/19 after choking episode with complaints of several months of progressively worsening dysphagia and odynophagia.  She also had a nosebleed which made her worried that her low hemoglobin because she has felt short of breath since that time.  In the emergency room, patient noted to have hemoglobin of 5.8 with an MCV of 67 and stable vitals.  Patient admitted to the hospitalist service and gastroenterology consulted.  Patient reportedly has never gotten a transfusion. Upon admission, patient was ordered transfusion of 2 unit packed red blood cells, however upon initiation of transfusion, patient started developing some facial swelling concerning for transfusion reaction and transfusion held. Interestingly, follow-up hemoglobin notes increase although patient did not receive much blood.   Assessment and Plan: Hypertension, essential, benign Assessment & Plan BP stable  Continue home medications  Dysphagia Naila esophagitis Assessment & Plan Certainly concerning for worsening dysphagia over the past few months S/P EGD by GI, noted Tineka esophagitis, gastritis which was biopsied, no overt evidence of bleeding Continue pantoprazole 40 mg daily Nystatin suspension for 5 days, Magic mouthwash Outpatient follow-up with colonoscopy  History of esophageal stricture Assessment & Plan As discussed above  Iron deficiency anemia Assessment & Plan Unknown etiology EGD as above, colonoscopy as outpatient as recommended by GI In review of her records, it looks like she had a referral for hematology at [facility]  in August but did not go Plan to consult heme-onc  Diabetes type 2, controlled ([place]) Assessment & Plan A1c pending SSI, Accu-Cheks, hypoglycemic protocol  Abnormal abdominal CT scan Noted abdominal pain Assessment & Plan Noted mid-epigastric abdominal pain  Previous hx of hernia surgery x2 with mesh Patient found to have small ring-enhancing lesion felt to be fluid collection Patient does not look septic (will discuss with gen surg) Follow-up with her surgeon at [place] pain, worse on left Assessment & Plan Patient states that this has been going on now for over a month since she had a bad fall. Hx of sciatica before and this is different BLE Doppler, negative for DVT Interestingly, looks like she had a referral for bilateral leg pain to vascular surgery, Dopplers unremarkable, ABIs pending  Obesity (BMI 30-39.9) Assessment & Plan     Lifestyle modification advised  Depression Assessment & Plan Continue home medications  Chronic pain Assessment & Plan Continue home medications plus bowel regimen  Transfusion reaction Assessment & Plan Unclear etiology what caused this.  Patient has had successful transfusions in the past.    Body mass index is 33.11 kg/m.  Nutrition Problem: Increased nutrient needs Etiology: acute illness     Consultants: Gastroenterology  Procedures: Attempted transfusion of 1 unit packed red blood cells.  Stopped after reaction developed  Antimicrobials: None  Code Status: Full code  Communicated through daughters   Subjective: Patient with multiple complaints, including bilateral lower extremity pain, mouth/dental pain, abdominal pain, weakness/dizziness.  Patient denies any chest pain, shortness of breath, nausea/vomiting, fever/chills.  Discussed extensively with daughters about medical management.    Objective: Vital signs were reviewed and unremarkable. Vitals:   12/02/21 0950 12/02/21 1635  BP: (!) 126/94 105/69  Pulse: 84 87  Resp: 18 19  Temp: 98.8 F (37.1 C) 98.6 F (37 C)  SpO2: 100% 97%    Intake/Output Summary (Last 24 hours) at 12/02/2021 1735 Last data filed at 12/02/2021 0950 Gross per 24 hour  Intake 505 ml  Output --  Net 505 ml   Filed Weights   12/01/21 1108 12/02/21 0800  Weight: 76.9 kg 76.9 kg   Body mass index is 33.11 kg/m.  Exam: General: NAD  Cardiovascular: S1, S2 present Respiratory: CTAB Abdomen: Soft, ++tender, nondistended, bowel sounds present Musculoskeletal: No bilateral pedal edema noted Skin: Numerous circular pruritic lesions on upper chest Psychiatry: Normal mood     Disposition:  Status is: Inpatient  Remains inpatient appropriate because: Level of care   Family Communication: Discussed with daughters at the bedside DVT Prophylaxis: SCDs Start: 12/01/21 0340    Author: Alma Friendly ,MD 12/02/2021 5:35 PM  To reach On-call, see care teams to locate the attending and reach out via [email or web address]. Between 7PM-7AM, please contact night-coverage

## 2021-12-02 NOTE — Progress Notes (Signed)
Pt off unit to endo

## 2021-12-02 NOTE — TOC Initial Note (Signed)
Transition of Care [facility]) - Initial/Assessment Note    Patient Details  Name: [name] MRN: [id number] Date of Birth: 1971-06-08  Transition [facility]) CM/SW Contact:    Tom-Johnson, Hershal Coria, RN Phone Number: 12/02/2021, 4:25 PM  Clinical Narrative:                 CM spoke with patient and two daughter at bedside. Patient is Spanish speaking and daughters translate. Admitted for Iron Deficiency Anemia with Hgb of 5.7. Had a reaction when first unit of blood attempted and now hgb is 7.0. Patient states she lives at home with her two daughters. Has three children and her son lives with his father. Currently on disability. Independent with care prior to hospitalization and drive self sometimes. Daughters transport when patient is not able to drive. Has a cane, walker, built in shower seat at home. PCP is at [facility] remember name. Uses [facility] pharmacy on [address] . No recommendations noted at this time. CM will continue to follow with needs.    Barriers to Discharge: Continued Medical Work up   Patient Goals and CMS Choice Patient states their goals for this hospitalization and ongoing recovery are:: To return home CMS Medicare.gov Compare Post Acute Care list provided to:: Patient    Expected Discharge Plan and Services     Discharge Planning Services: CM Consult   Living arrangements for the past 2 months: Apartment                                      Prior Living Arrangements/Services Living arrangements for the past 2 months: Apartment Lives with:: Adult Children (Two daughters) Patient language and need for interpreter reviewed:: Yes Do you feel safe going back to the place where you live?: Yes      Need for Family Participation in Patient Care: Yes (Comment) Care giver support system in place?: Yes (comment) Current home services: DME (Walker, cane, built in shower seat.) Criminal Activity/Legal Involvement Pertinent to  Current Situation/Hospitalization: No - Comment as needed  Activities of Daily Living Home Assistive Devices/Equipment: Cane (specify quad or straight) ADL Screening (condition at time of admission) Patient's cognitive ability adequate to safely complete daily activities?: Yes Is the patient deaf or have difficulty hearing?: No Does the patient have difficulty seeing, even when wearing glasses/contacts?: No Does the patient have difficulty concentrating, remembering, or making decisions?: No Patient able to express need for assistance with ADLs?: Yes Does the patient have difficulty dressing or bathing?: No Independently performs ADLs?: Yes (appropriate for developmental age) Does the patient have difficulty walking or climbing stairs?: No Weakness of Legs: None Weakness of Arms/Hands: None  Permission Sought/Granted Permission sought to share information with : Case Manager, Family Supports Permission granted to share information with : Yes, Verbal Permission Granted              Emotional Assessment Appearance:: Appears stated age Attitude/Demeanor/Rapport: Engaged, Gracious Affect (typically observed): Accepting, Appropriate, Calm, Hopeful Orientation: : Oriented to Self, Oriented to Place, Oriented to  Time, Oriented to Situation Alcohol / Substance Use: Not Applicable Psych Involvement: No (comment)  Admission diagnosis:  Iron deficiency anemia [D50.9] Food impaction of esophagus, initial encounter [T18.128A] Symptomatic anemia [D64.9] Severe anemia [D64.9] Patient Active Problem List   Diagnosis Date Noted   Iron deficiency anemia 12/01/2021   Dysphagia 12/01/2021   History of esophageal stricture 12/01/2021  Transfusion reaction 12/01/2021   Chronic pain 12/01/2021   Depression 12/01/2021   Obesity (BMI 30-39.9) 12/01/2021   Left leg pain 12/01/2021   Abnormal abdominal CT scan 12/01/2021   Symptomatic anemia 12/01/2021   MRSA (methicillin resistant staph  aureus) culture positive 05/09/2017   Hypertension, essential, benign 05/09/2017   Diabetes type 2, controlled (HCC) 05/09/2017   Morbid obesity (HCC) 05/09/2017   Asthma, moderate persistent 05/09/2017   AKI (acute kidney injury) (HCC) 05/07/2017   Left genital labial abscess 05/05/2017   PCP:  [facility]:   [id number] [place], [place] - [address] AT [facility] OF [facility] & [address] [place] [address] Phone: [phone] Fax: [phone]  [facility] - [place], [place] - [address] [place] [address] Phone: [phone] Fax: [phone]     Social Determinants of Health (SDOH) Interventions    Readmission Risk Interventions No flowsheet data found.

## 2021-12-02 NOTE — Op Note (Signed)
[facility] Patient Name: [name] Procedure Date : 12/02/2021 MRN: [id number] Attending MD: Napoleon Form , MD Date of Birth: 10/19/1971 CSN: [id number] Age: 51 Admit Type: Inpatient Procedure:                Upper GI endoscopy Indications:              Recent gastrointestinal bleeding, Suspected upper                            gastrointestinal bleeding, Suspected upper                            gastrointestinal bleeding in patient with                            unexplained iron deficiency anemia Providers:                Napoleon Form, MD, Lorenza Evangelist, RN,                            Priscella Mann, Technician Referring MD:              Medicines:                Monitored Anesthesia Care Complications:            No immediate complications. Estimated Blood Loss:     Estimated blood loss was minimal. Procedure:                Pre-Anesthesia Assessment:                           - Prior to the procedure, a History and Physical                            was performed, and patient medications and                            allergies were reviewed. The patient's tolerance of                            previous anesthesia was also reviewed. The risks                            and benefits of the procedure and the sedation                            options and risks were discussed with the patient.                            All questions were answered, and informed consent                            was obtained. Prior Anticoagulants: The patient has                            taken no previous  anticoagulant or antiplatelet                            agents. ASA Grade Assessment: III - A patient with                            severe systemic disease. After reviewing the risks                            and benefits, the patient was deemed in                            satisfactory condition to undergo the procedure.                           After  obtaining informed consent, the endoscope was                            passed under direct vision. Throughout the                            procedure, the patient's blood pressure, pulse, and                            oxygen saturations were monitored continuously. The                            [id number]-[id number] ([id number] Olympus endoscope was introduced                            through the mouth, and advanced to the second part                            of duodenum. The upper GI endoscopy was                            accomplished without difficulty. The patient                            tolerated the procedure well. Scope In: Scope Out: Findings:      Noted fresh heme in the posterior pharynx, will need to exclude       posterior nasal bleed      Esophagitis with no bleeding was found 25 to 27 cm from the incisors.      The Z-line was regular and was found 36 cm from the incisors.      Patchy mild inflammation characterized by congestion (edema), erosions       and erythema was found in the gastric antrum, in the prepyloric region       of the stomach and at the pylorus. Biopsies were taken with a cold       forceps for Helicobacter pylori testing.      The cardia and gastric fundus were normal on retroflexion.      The examined duodenum was normal. Impression:               -  Candidiasis esophagitis with no bleeding.                           - Z-line regular, 36 cm from the incisors.                           - Gastritis. Biopsied.                           - Normal examined duodenum. Recommendation:           - Patient has a contact number available for                            emergencies. The signs and symptoms of potential                            delayed complications were discussed with the                            patient. Return to normal activities tomorrow.                            Written discharge instructions were provided to the                             patient.                           - Resume previous diet.                           - Continue present medications.                           - Await pathology results.                           - Use Protonix (pantoprazole) 40 mg PO daily.                           - Use daily chelated iron capsule                           - Nystatin suspension 100,000 units PO QID for 5                            days.                           - Follow up with outpatient GI for surveillance                            colonoscopy                           - GI will sign off, please call with any questions Procedure Code(s):        ---  Professional ---                           [phone], Esophagogastroduodenoscopy, flexible,                            transoral; with biopsy, single or multiple Diagnosis Code(s):        --- Professional ---                           B37.81, Candidal esophagitis                           K29.70, Gastritis, unspecified, without bleeding                           K92.2, Gastrointestinal hemorrhage, unspecified                           D50.9, Iron deficiency anemia, unspecified CPT copyright 2019 American Medical Association. All rights reserved. The codes documented in this report are preliminary and upon coder review may  be revised to meet current compliance requirements. Napoleon Form, MD 12/02/2021 9:12:42 AM This report has been signed electronically. Number of Addenda: 0

## 2021-12-02 NOTE — Interval H&P Note (Signed)
History and Physical Interval Note:  12/02/2021 8:36 AM  Hgb improved to 7.1  [name]  has presented today for surgery, with the diagnosis of anemia, dysphagia, history of PUD.  The various methods of treatment have been discussed with the patient and family. After consideration of risks, benefits and other options for treatment, the patient has consented to  Procedure(s): ESOPHAGOGASTRODUODENOSCOPY (EGD) WITH PROPOFOL (N/A) as a surgical intervention.  The patient's history has been reviewed, patient examined, no change in status, stable for surgery.  I have reviewed the patient's chart and labs.  Questions were answered to the patient's satisfaction.     [name]

## 2021-12-02 NOTE — Anesthesia Postprocedure Evaluation (Signed)
Anesthesia Post Note  Patient: [name]  Procedure(s) Performed: ESOPHAGOGASTRODUODENOSCOPY (EGD) WITH PROPOFOL BIOPSY     Patient location during evaluation: PACU Anesthesia Type: MAC Level of consciousness: awake and alert Pain management: pain level controlled Vital Signs Assessment: post-procedure vital signs reviewed and stable Respiratory status: spontaneous breathing Cardiovascular status: stable Anesthetic complications: no   No notable events documented.  Last Vitals:  Vitals:   12/02/21 0940 12/02/21 0950  BP: 136/78 (!) 126/94  Pulse: 83 84  Resp: 17 18  Temp: 36.9 C 37.1 C  SpO2: 99% 100%    Last Pain:  Vitals:   12/02/21 1546  TempSrc:   PainSc: [place]

## 2021-12-02 NOTE — Progress Notes (Signed)
Lower extremity venous has been completed.   Preliminary results in CV Proc.   Jinny Blossom Janee Ureste 12/02/2021 10:59 AM

## 2021-12-02 NOTE — Transfer of Care (Signed)
Immediate Anesthesia Transfer of Care Note  Patient: [name]  Procedure(s) Performed: ESOPHAGOGASTRODUODENOSCOPY (EGD) WITH PROPOFOL BIOPSY  Patient Location: PACU  Anesthesia Type:MAC  Level of Consciousness: awake and alert   Airway & Oxygen Therapy: Patient Spontanous Breathing and Patient connected to nasal cannula oxygen  Post-op Assessment: Report given to RN and Post -op Vital signs reviewed and stable  Post vital signs: Reviewed and stable  Last Vitals:  Vitals Value Taken Time  BP 149/80 12/02/21 0911  Temp    Pulse 74 12/02/21 0912  Resp 12 12/02/21 0912  SpO2 100 % 12/02/21 0912  Vitals shown include unvalidated device data.  Last Pain:  Vitals:   12/02/21 0800  TempSrc: Temporal  PainSc: Asleep         Complications: No notable events documented.

## 2021-12-02 NOTE — Anesthesia Procedure Notes (Signed)
Procedure Name: MAC Date/Time: 12/02/2021 8:43 AM Performed by: Janene Harvey, CRNA Pre-anesthesia Checklist: Patient identified, Emergency Drugs available, Suction available and Patient being monitored Patient Re-evaluated:Patient Re-evaluated prior to induction Oxygen Delivery Method: Nasal cannula Induction Type: IV induction Placement Confirmation: positive ETCO2 Dental Injury: Teeth and Oropharynx as per pre-operative assessment

## 2021-12-02 NOTE — Progress Notes (Signed)
Initial Nutrition Assessment  DOCUMENTATION CODES:   Not applicable  INTERVENTION:   - Ensure Max po BID, each supplement provides 150 kcal and 30 grams of protein.   - MVI with minerals daily  NUTRITION DIAGNOSIS:   Increased nutrient needs related to acute illness as evidenced by estimated needs.  GOAL:   Patient will meet greater than or equal to 90% of their needs  MONITOR:   PO intake, Supplement acceptance, Labs, Weight trends  REASON FOR ASSESSMENT:   Malnutrition Screening Tool    ASSESSMENT:   51 year old female who presented to the ED on 1/19 with SOB. PMH of T2DM, HTN, iron deficiency anemia, esophageal stricture requiring dilation. Pt admitted with iron deficiency anemia.  01/20 - s/p upper GI endoscopy showing candidiasis esophagitis and gastritis  Spoke with pt and daughters at bedside. Utilized [employer] [phone]). Pt reports soreness "all over" after her EGD this AM. Pt states that prior to her procedure while admitted, she has been consuming soup, jello, and juice. Noted pt was on clear liquid diet yesterday.  Pt reports that PTA, she was eating normally and had a great appetite. Pt states that she eats 3 meals daily. Meals consist of items like rice, meat, and vegetables. Pt reports having issues swallowing foods like meat and rice and difficulty swallowing pills. Pt reports sensation of food or pills getting stuck in her throat. She also reports some pain with drinking liquids. Pt states that despite this, she has not changed her eating habits or the amount of food she has eaten. She just eats at a slower pace.  Pt reports that she used to weigh 288 lbs. Pt states that her doctor put her on insulin and a pill (Phentermine) to help her lose weight. Pt reports intentional weight loss down to 166-167 lbs. Pt states that she also had to lose weight for her hernia repair surgeries and reports that she is about to have another hernia repair  surgery.  Reviewed weight history in chart. Last weight available is from 02/05/20 (95.3 kg). Pt with an 18.4 kg weight loss since that date. This is a 19.3% weight loss in 22 months which is not clinically significant for timeframe. Based on pt reports, weight loss was intentional.  Noted pt with a Premier Protein shake in room from home. Pt reports occasionally drinking these PTA. Pt is willing to consume Ensure Max supplement during admission. RD to order. Will also order daily MVI with minerals and encourage PO intake. Pt reporting feeling hungry at time of RD visit and placed lunch order during RD visit.  Medications reviewed and include: ferrous sulfate, lasix, IV protonix  Labs reviewed: hemoglobin 7.0  NUTRITION - FOCUSED PHYSICAL EXAM:  Flowsheet Row Most Recent Value  Orbital Region Mild depletion  Upper Arm Region No depletion  Thoracic and Lumbar Region No depletion  Buccal Region No depletion  Temple Region Mild depletion  Clavicle Bone Region Mild depletion  Clavicle and Acromion Bone Region Mild depletion  Scapular Bone Region No depletion  Dorsal Hand No depletion  Patellar Region No depletion  Anterior Thigh Region Mild depletion  Posterior Calf Region No depletion  Edema (RD Assessment) None  Hair Reviewed  Eyes Reviewed  Mouth Reviewed  Skin Reviewed  Nails Reviewed       Diet Order:   Diet Order             Diet regular Room service appropriate? Yes; Fluid consistency: Thin  Diet effective now  EDUCATION NEEDS:   Education needs have been addressed  Skin:  Skin Assessment: Reviewed RN Assessment  Last BM:  no documented BM  Height:   Ht Readings from Last 1 Encounters:  12/02/21 5' (1.524 m)    Weight:   Wt Readings from Last 1 Encounters:  12/02/21 76.9 kg    BMI:  Body mass index is 33.11 kg/m.  Estimated Nutritional Needs:   Kcal:  1800-2000  Protein:  90-110 grams  Fluid:  >/= 1.8 L    Mertie Clause, MS, RD, LDN Inpatient Clinical Dietitian Please see AMiON for contact information.

## 2021-12-03 ENCOUNTER — Inpatient Hospital Stay (HOSPITAL_COMMUNITY): Payer: 59

## 2021-12-03 DIAGNOSIS — M25569 Pain in unspecified knee: Secondary | ICD-10-CM

## 2021-12-03 LAB — CBC WITH DIFFERENTIAL/PLATELET
Abs Immature Granulocytes: 0.03 10*3/uL (ref 0.00–0.07)
Basophils Absolute: 0 10*3/uL (ref 0.0–0.1)
Basophils Relative: 1 %
Eosinophils Absolute: 0.2 10*3/uL (ref 0.0–0.5)
Eosinophils Relative: 3 %
HCT: 23.4 % — ABNORMAL LOW (ref 36.0–46.0)
Hemoglobin: 6.6 g/dL — CL (ref 12.0–15.0)
Immature Granulocytes: 0 %
Lymphocytes Relative: 41 %
Lymphs Abs: 3.6 10*3/uL (ref 0.7–4.0)
MCH: 20 pg — ABNORMAL LOW (ref 26.0–34.0)
MCHC: 28.2 g/dL — ABNORMAL LOW (ref 30.0–36.0)
MCV: 70.9 fL — ABNORMAL LOW (ref 80.0–100.0)
Monocytes Absolute: 0.8 10*3/uL (ref 0.1–1.0)
Monocytes Relative: 9 %
Neutro Abs: 4 10*3/uL (ref 1.7–7.7)
Neutrophils Relative %: 46 %
Platelets: 362 10*3/uL (ref 150–400)
RBC: 3.3 MIL/uL — ABNORMAL LOW (ref 3.87–5.11)
RDW: 22.8 % — ABNORMAL HIGH (ref 11.5–15.5)
WBC: 8.7 10*3/uL (ref 4.0–10.5)
nRBC: 0.2 % (ref 0.0–0.2)

## 2021-12-03 LAB — BASIC METABOLIC PANEL
Anion gap: 8 (ref 5–15)
BUN: 15 mg/dL (ref 6–20)
CO2: 25 mmol/L (ref 22–32)
Calcium: 8.6 mg/dL — ABNORMAL LOW (ref 8.9–10.3)
Chloride: 102 mmol/L (ref 98–111)
Creatinine, Ser: 1.11 mg/dL — ABNORMAL HIGH (ref 0.44–1.00)
GFR, Estimated: >60 mL/min (ref >60)
Glucose, Bld: 101 mg/dL — ABNORMAL HIGH (ref 70–99)
Potassium: 3.3 mmol/L — ABNORMAL LOW (ref 3.5–5.1)
Sodium: 135 mmol/L (ref 135–145)

## 2021-12-03 LAB — HEMOGLOBIN AND HEMATOCRIT, BLOOD
HCT: 22.8 % — ABNORMAL LOW (ref 36.0–46.0)
HCT: 31.7 % — ABNORMAL LOW (ref 36.0–46.0)
Hemoglobin: 6.5 g/dL — CL (ref 12.0–15.0)
Hemoglobin: 9.3 g/dL — ABNORMAL LOW (ref 12.0–15.0)

## 2021-12-03 LAB — PREPARE RBC (CROSSMATCH)

## 2021-12-03 MED ORDER — SODIUM CHLORIDE 0.9% IV SOLUTION: Freq: Once | INTRAVENOUS | Status: AC

## 2021-12-03 MED ORDER — DIPHENHYDRAMINE HCL 50 MG/ML IJ SOLN
25.0000 mg | Freq: Once | INTRAMUSCULAR | Status: AC
  Administered 2021-12-03: 25 mg via INTRAVENOUS
  Filled 2021-12-03: qty 1

## 2021-12-03 MED ORDER — ACETAMINOPHEN 325 MG PO TABS
650.0000 mg | ORAL_TABLET | Freq: Once | ORAL | Status: AC
  Administered 2021-12-03: 650 mg via ORAL
  Filled 2021-12-03: qty 2

## 2021-12-03 MED ORDER — FERROUS SULFATE 325 (65 FE) MG PO TABS
325.0000 mg | ORAL_TABLET | Freq: Every day | ORAL | Status: DC
  Administered 2021-12-04: 325 mg via ORAL
  Filled 2021-12-03: qty 1

## 2021-12-03 MED ORDER — POTASSIUM CHLORIDE 20 MEQ PO PACK
40.0000 meq | PACK | Freq: Once | ORAL | Status: AC
  Administered 2021-12-03: 40 meq via ORAL
  Filled 2021-12-03: qty 2

## 2021-12-03 MED ORDER — FLUCONAZOLE IN SODIUM CHLORIDE 400-0.9 MG/200ML-% IV SOLN
400.0000 mg | INTRAVENOUS | Status: DC
  Administered 2021-12-03 – 2021-12-04 (×2): 400 mg via INTRAVENOUS
  Filled 2021-12-03 (×2): qty 200

## 2021-12-03 MED ORDER — NYSTATIN 100000 UNIT/ML MT SUSP
5.0000 mL | Freq: Four times a day (QID) | OROMUCOSAL | Status: DC
Start: 2021-12-03 — End: 2021-12-04
  Administered 2021-12-03 – 2021-12-04 (×4): 500000 [IU] via ORAL
  Filled 2021-12-03 (×5): qty 5

## 2021-12-03 MED ORDER — FLUCONAZOLE IN SODIUM CHLORIDE 400-0.9 MG/200ML-% IV SOLN
400.0000 mg | Freq: Once | INTRAVENOUS | Status: DC
  Filled 2021-12-03: qty 200

## 2021-12-03 NOTE — Progress Notes (Signed)
°   12/03/21 0825  Provider Notification  Provider Name/Title Dr. Horris Latino  Date Provider Notified 12/03/21  Time Provider Notified 0825  Notification Type Page  Notification Reason Critical result  Test performed and critical result HGB 6.5  Date Critical Result Received 12/03/21  Time Critical Result Received 0824  Provider response See new orders (pRBC x2 units)

## 2021-12-03 NOTE — Plan of Care (Signed)
  Problem: Pain Managment: Goal: General experience of comfort will improve Outcome: Not Progressing   Problem: Clinical Measurements: Goal: Ability to maintain clinical measurements within normal limits will improve Outcome: Not Progressing   

## 2021-12-03 NOTE — Progress Notes (Signed)
[facility] Progress Note  Patient: [name]    VVO:[id number]  DOA: 12/01/2021    Date of Service: the patient was seen and examined on 12/03/2021  Brief hospital course: 51 year old female with past medical history of diabetes mellitus type 2, hypertension plus previous history of esophageal stricture requiring dilatation presented to the emergency room on the early morning of 1/19 after choking episode with complaints of several months of progressively worsening dysphagia and odynophagia.  She also had a nosebleed which made her worried that her low hemoglobin because she has felt short of breath since that time.  In the emergency room, patient noted to have hemoglobin of 5.8 with an MCV of 67 and stable vitals.  Patient admitted to the hospitalist service and gastroenterology consulted.  Patient reportedly has never gotten a transfusion. Upon admission, patient was ordered transfusion of 2 unit packed red blood cells, however upon initiation of transfusion, patient started developing some facial swelling concerning for transfusion reaction and transfusion held. Interestingly, follow-up hemoglobin notes increase although patient did not receive much blood.   Assessment and Plan: Hypertension, essential, benign Assessment & Plan BP stable  Continue home medications  Dysphagia Jahna esophagitis Assessment & Plan Certainly concerning for worsening dysphagia over the past few months S/P EGD by GI, noted Bobby esophagitis, gastritis which was biopsied, no overt evidence of bleeding Continue pantoprazole 40 mg daily Nystatin suspension for 5 days, Magic mouthwash, started IV fluconazole Outpatient follow-up with colonoscopy  History of esophageal stricture Assessment & Plan As discussed above  Iron deficiency anemia Chronic blood loss anemia Assessment & Plan Hemoglobin dropped to 6.5 on 12/03/2021, no evidence of bleeding EGD as above, colonoscopy as outpatient as  recommended by GI Transfused 2 units of PRBC on 12/03/2021, premedicate before transfusion Continue oral iron supplementation In review of her records, it looks like she had a referral for hematology at [facility] in August but did not go Discussed with hematologist Dr.Iruku, recommended outpatient follow-up since issue appears to be chronic, with no further inpatient work-up  Diabetes type 2, controlled (HCC) Assessment & Plan A1c noted to be 6 SSI, Accu-Cheks, hypoglycemic protocol  Abnormal abdominal CT scan Noted abdominal pain Assessment & Plan Noted mid-epigastric abdominal pain  Previous hx of hernia surgery x2 with mesh Patient found to have small ring-enhancing lesion felt to be fluid collection Patient does not look septic (will discuss with gen surg) Follow-up with her surgeon at [facility]  BLE pain, worse on left Assessment & Plan Patient states that this has been going on now for over a month since she had a bad fall. Hx of sciatica before and this is different BLE Doppler, negative for DVT Interestingly, looks like she had a referral for bilateral leg pain to vascular surgery ABIs pending result  Mild AKI Likely 2/2 diuresis Creatinine bumped to 1.1 from 0.66 Hold home Lasix Daily BMP  Hypokalemia Replace as needed  Obesity (BMI 30-39.9) Assessment & Plan     Lifestyle modification advised  Depression Assessment & Plan Continue home medications  Chronic pain Assessment & Plan Continue home medications plus bowel regimen  Transfusion reaction Assessment & Plan Unclear etiology, as patient currently tolerated transfusion on 12/03/2021 after premedication    Body mass index is 33.11 kg/m.  Nutrition Problem: Increased nutrient needs Etiology: acute illness     Consultants: Gastroenterology  Procedures: None  Antimicrobials: None  Code Status: Full code     Subjective:  Patient continues to have multiple complaints, including  dysphagia, odynophagia, dental pain, abdominal pain, bilateral lower extremity pain.  He has a history of chronic pain syndrome.  Denies any chest pain, shortness of breath, fever/chills.    Objective: Vital signs were reviewed and unremarkable. Vitals:   12/03/21 1615 12/03/21 1644  BP: 111/60 122/71  Pulse: 86 87  Resp: 16 18  Temp: 98.7 F (37.1 C) 98.2 F (36.8 C)  SpO2: 98% 98%    Intake/Output Summary (Last 24 hours) at 12/03/2021 1747 Last data filed at 12/03/2021 1537 Gross per 24 hour  Intake 629.83 ml  Output 0 ml  Net 629.83 ml   Filed Weights   12/01/21 1108 12/02/21 0800  Weight: 76.9 kg 76.9 kg   Body mass index is 33.11 kg/m.  Exam: General: NAD  Cardiovascular: S1, S2 present Respiratory: CTAB Abdomen: Soft, ++tender, nondistended, bowel sounds present Musculoskeletal: No bilateral pedal edema noted Skin: Numerous circular pruritic lesions on upper chest Psychiatry: Normal mood     Disposition:  Status is: Inpatient  Remains inpatient appropriate because: Level of care   Family Communication: Discussed with husband at the bedside DVT Prophylaxis: SCDs Start: 12/01/21 0340    Author: Briant Cedar ,MD 12/03/2021 5:47 PM  To reach On-call, see care teams to locate the attending and reach out via [email or web address]. Between 7PM-7AM, please contact night-coverage

## 2021-12-03 NOTE — Progress Notes (Signed)
Pharmacy Antibiotic Note  [name] is a 51 y.o. female admitted on 12/01/2021 with  esophageal candidiasis .  Patient has had several months of progressively worsening dysphagia and odynophagia and had significant choking episodes PTA. Underwent EGD on 1/20, that noted [name]. Pharmacy has been consulted for IV fluconazole dosing.   Of note: admitted with a Hgb of 5.8, patient had transfusion reaction.  Additionally, patient had a significant bump in SCr today to 1.11 (0.57 on admit). MD dc'd lasix for now  Plan: Start fluconazole 400 mg IV daily Monitor clinical progress, cultures/sensitivities, renal function, abx plan   Height: 5' (152.4 cm) Weight: 76.9 kg (169 lb 8.5 oz) IBW/kg (Calculated) : 45.5  Temp (24hrs), Avg:98.7 F (37.1 C), Min:98.4 F (36.9 C), Max:98.8 F (37.1 C)  Recent Labs  Lab 12/01/21 0136 12/01/21 0834 12/02/21 0419 12/03/21 0041  WBC 5.8 5.8 5.8 8.7  CREATININE 0.57 0.64 0.66 1.11*    Estimated Creatinine Clearance: 55 mL/min (A) (by C-G formula based on SCr of 1.11 mg/dL (H)).    Allergies  Allergen Reactions   Penicillins Itching    Antimicrobials this admission: 1/21 fluconazole >>  1/20 nystatin >>   Dose adjustments this admission:   Thank you for allowing [place] to participate in this patients care. Signe Colt, PharmD 12/03/2021 9:20 AM  **Pharmacist phone directory can be found on [email or web address] listed under [facility]**

## 2021-12-03 NOTE — Progress Notes (Addendum)
0131- Critical result hemoglobin of 6.6 relayed to [facility] and Dr. Loney Loh.  At [phone] paged Dr. Loney Loh again for follow up and to notify about potassium of 3.3. also paged admitting to confirm if Dr. Loney Loh is the one covering floor and she said yes.   At 0357, paged Dr. Loney Loh regarding plan for critical hemoglobin, tried to call phone but went to voice activated response.  At 0458, paged dr. Loney Loh, she said she will check on it.   At [phone], spoke to dr. Loney Loh, she said we'll wait for hematolgy consult since patient had transfusion reaction last  time for any correction. Informed her also of blood pressure of 100/49 and that patient is asking for oxycodone, per dr. Loney Loh, only give tylenol for now with the current BP.

## 2021-12-03 NOTE — Progress Notes (Addendum)
VASCULAR LAB    ABIs have been performed.    Preliminary report: ABIs and TBIs are within normal limits with normal waveforms.     Dyan Creelman, RVT 12/03/2021, 10:42 AM

## 2021-12-03 NOTE — Progress Notes (Signed)
pRBC x1 unit complete, VS remained stable throughout. Pre-meds given as ordered. TO receive one more unit. Daughter and husband at bedside.

## 2021-12-04 DIAGNOSIS — G8929 Other chronic pain: Secondary | ICD-10-CM

## 2021-12-04 LAB — CBC WITH DIFFERENTIAL/PLATELET
Abs Immature Granulocytes: 0.05 10*3/uL (ref 0.00–0.07)
Basophils Absolute: 0 10*3/uL (ref 0.0–0.1)
Basophils Relative: 1 %
Eosinophils Absolute: 0.2 10*3/uL (ref 0.0–0.5)
Eosinophils Relative: 2 %
HCT: 28.2 % — ABNORMAL LOW (ref 36.0–46.0)
Hemoglobin: 8.2 g/dL — ABNORMAL LOW (ref 12.0–15.0)
Immature Granulocytes: 1 %
Lymphocytes Relative: 19 %
Lymphs Abs: 1.5 10*3/uL (ref 0.7–4.0)
MCH: 21.9 pg — ABNORMAL LOW (ref 26.0–34.0)
MCHC: 29.1 g/dL — ABNORMAL LOW (ref 30.0–36.0)
MCV: 75.2 fL — ABNORMAL LOW (ref 80.0–100.0)
Monocytes Absolute: 0.6 10*3/uL (ref 0.1–1.0)
Monocytes Relative: 8 %
Neutro Abs: 5.6 10*3/uL (ref 1.7–7.7)
Neutrophils Relative %: 69 %
Platelets: 334 10*3/uL (ref 150–400)
RBC: 3.75 MIL/uL — ABNORMAL LOW (ref 3.87–5.11)
RDW: 23.8 % — ABNORMAL HIGH (ref 11.5–15.5)
WBC: 8 10*3/uL (ref 4.0–10.5)
nRBC: 0 % (ref 0.0–0.2)

## 2021-12-04 LAB — BPAM RBC
Blood Product Expiration Date: 202302042359
Blood Product Expiration Date: 202302042359
Blood Product Expiration Date: 202302122359
ISSUE DATE / TIME: 202301190327
ISSUE DATE / TIME: 202301211307
ISSUE DATE / TIME: 202301211625
Unit Type and Rh: 6200
Unit Type and Rh: 6200
Unit Type and Rh: 6200

## 2021-12-04 LAB — TYPE AND SCREEN
ABO/RH(D): A POS
Antibody Screen: NEGATIVE
Unit division: 0
Unit division: 0
Unit division: 0

## 2021-12-04 LAB — BASIC METABOLIC PANEL
Anion gap: 5 (ref 5–15)
BUN: 14 mg/dL (ref 6–20)
CO2: 26 mmol/L (ref 22–32)
Calcium: 8.7 mg/dL — ABNORMAL LOW (ref 8.9–10.3)
Chloride: 104 mmol/L (ref 98–111)
Creatinine, Ser: 0.93 mg/dL (ref 0.44–1.00)
GFR, Estimated: >60 mL/min (ref >60)
Glucose, Bld: 87 mg/dL (ref 70–99)
Potassium: 4.5 mmol/L (ref 3.5–5.1)
Sodium: 135 mmol/L (ref 135–145)

## 2021-12-04 LAB — MAGNESIUM: Magnesium: 2.1 mg/dL (ref 1.7–2.4)

## 2021-12-04 MED ORDER — FLUCONAZOLE 200 MG PO TABS: 400.0000 mg | ORAL_TABLET | Freq: Every day | ORAL | 0 refills | Status: AC

## 2021-12-04 MED ORDER — FERROUS SULFATE 325 (65 FE) MG PO TABS: 325.0000 mg | ORAL_TABLET | Freq: Every day | ORAL | 0 refills | Status: AC

## 2021-12-04 MED ORDER — NYSTATIN 100000 UNIT/ML MT SUSP: 5.0000 mL | Freq: Four times a day (QID) | OROMUCOSAL | 0 refills | Status: AC

## 2021-12-04 MED ORDER — ALBUTEROL SULFATE (2.5 MG/3ML) 0.083% IN NEBU: 2.5000 mg | INHALATION_SOLUTION | RESPIRATORY_TRACT | 0 refills | Status: AC | PRN

## 2021-12-04 NOTE — Progress Notes (Signed)
°  Transition of Care (TOC) Screening Note   Patient Details  Name: Coretta Silver Huguenin Date of Birth: [birth year]   Transition of Care [facility]) CM/SW Contact:    Bess Kinds, RN Phone Number: 12/04/2021, 3:56 PM    Transition of Care Department [facility]) has reviewed patient and no TOC needs have been identified at this time.

## 2021-12-04 NOTE — Discharge Summary (Signed)
Discharge Summary  [name] ZOX:[id number] DOB: 25-Jun-1971  PCP: [facility]  Admit date: 12/01/2021 Discharge date: 12/04/2021  Time spent: 40 mins  Recommendations for Outpatient Follow-up:  Follow-up with PCP in 1 week Follow-up with hematology once scheduled Follow-up with your outpatient general surgeon as discussed   Discharge Diagnoses:  Active Hospital Problems   Diagnosis Date Noted   Iron deficiency anemia 12/01/2021   Dysphagia 12/01/2021   History of esophageal stricture 12/01/2021   Transfusion reaction 12/01/2021   Chronic pain 12/01/2021   Depression 12/01/2021   Obesity (BMI 30-39.9) 12/01/2021   Left leg pain 12/01/2021   Abnormal abdominal CT scan 12/01/2021   Symptomatic anemia 12/01/2021   Hypertension, essential, benign 05/09/2017   Diabetes type 2, controlled ([place]) 05/09/2017    Resolved Hospital Problems  No resolved problems to display.    Discharge Condition: Stable  Diet recommendation: Heart healthy  Vitals:   12/04/21 0819 12/04/21 0919  BP:  (!) 119/55  Pulse:  84  Resp:  18  Temp:  99 F (37.2 C)  SpO2: 98% 93%    History of present illness:  51 year old female with past medical history of diabetes mellitus type 2, hypertension plus previous history of esophageal stricture requiring dilatation presented to the emergency room on the early morning of 1/19 after choking episode with complaints of several months of progressively worsening dysphagia and odynophagia.  She also had a nosebleed which made her worried that her low hemoglobin because she has felt short of breath since that time.  In the emergency room, patient noted to have hemoglobin of 5.8 with an MCV of 67 and stable vitals. Patient admitted to the hospitalist service and gastroenterology consulted. Upon admission, patient was ordered transfusion of 2 unit packed red blood cells, however upon initiation of transfusion, patient started developing some facial  swelling concerning for transfusion reaction and transfusion held. Interestingly, follow-up hemoglobin notes increased hgb although patient did not receive much blood.  Patient had an EGD with no obvious signs of active bleeding.  Subsequently patient hemoglobin dropped again, patient was premedicated and successfully transfused with 2 units of PRBC.  Discussed with hematology who recommended outpatient follow-up.    Today, patient with no new complaints, continues to talk about previous complaints.  Addressed all questions and complaints. Daughter and husband at bedside, discussed  discharge plans, verbalized understanding. Patient advised to follow-up with primary care in 1 week, hematologist and patient's outpatient surgeon in [facility].  Patient and family verbalized understanding.   Hospital Course:  Principal Problem:   Iron deficiency anemia Active Problems:   Hypertension, essential, benign   Diabetes type 2, controlled ([place])   Dysphagia   History of esophageal stricture   Transfusion reaction   Chronic pain   Depression   Obesity (BMI 30-39.9)   Left leg pain   Abnormal abdominal CT scan   Symptomatic anemia   Hypertension, essential, benign Assessment & Plan BP stable  Continue home medications   Dysphagia [name] esophagitis Assessment & Plan Certainly concerning for worsening dysphagia over the past few months S/P EGD by GI, noted [name] esophagitis, gastritis which was biopsied, no overt evidence of bleeding, no mention of any strictures Continue omeprazole 40 mg daily Continue Nystatin suspension for a total of 5 days, PO fluconazole for a total of 14 days Outpatient GI follow-up with colonoscopy and biopsy report   History of esophageal stricture Assessment & Plan As discussed above   Iron deficiency anemia Chronic blood  loss anemia Assessment & Plan Hemoglobin dropped to 6.5 on 12/03/2021, no evidence of obvious bleeding EGD as above, colonoscopy as  outpatient as recommended by GI Transfused 2 units of PRBC on 12/03/2021, premedicated before transfusion, with improvement in hemoglobin to 9.3-->8.5 Continue oral iron supplementation Discussed with hematologist Dr.Iruku, recommended outpatient follow-up since issue appears to be chronic, with no further inpatient work-up   Diabetes type 2, controlled ([place]) Assessment & Plan A1c noted to be 6 Continue home regimen   Abnormal abdominal CT scan Noted abdominal pain Assessment & Plan Noted mid-epigastric to lower abdominal pain  Continues to remain afebrile, with no leukocytosis Previous hx of hernia surgery x2 with mesh Patient found to have small ring-enhancing lesion felt to be fluid collection, discussed with gen surg on 12/04/21, 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 Advise to follow up with her surgeon at [place] pain, worse on left Assessment & Plan Patient states that this has been going on now for over a month since she had a bad fall. Hx of sciatica BLE Doppler, negative for DVT ABIs pending result, follow up with PCP Follow up referral for bilateral leg pain to vascular surgery as an outpatient   Mild AKI Improved Creatinine bumped to 1.1 from 0.66 Continue home lasix, follow up with PCP   Obesity (BMI 30-39.9) Assessment & Plan     Lifestyle modification advised   Depression Assessment & Plan Continue home medications   Chronic pain Assessment & Plan Continue home medications plus bowel regimen   Transfusion reaction Assessment & Plan Unclear etiology, as patient currently tolerated transfusion on 12/03/2021 after premedication with Benadryl and Tylenol      Malnutrition Type:  Nutrition Problem: Increased nutrient needs Etiology: acute illness   Malnutrition Characteristics:  Signs/Symptoms: estimated needs   Nutrition Interventions:  Interventions: MVI, Premier Protein   Estimated body mass index is 33.11 kg/m as calculated  from the following:   Height as of this encounter: 5' (1.524 m).   Weight as of this encounter: 76.9 kg.    Procedures: EGD  Consultations: GI General surgery Discussed with hematology  Discharge Exam: BP (!) 119/55    Pulse 84    Temp 99 F (37.2 C) (Oral)    Resp 18    Ht 5' (1.524 m)    Wt 76.9 kg    SpO2 93%    BMI 33.11 kg/m   General: NAD  Cardiovascular: S1, S2 present Respiratory: CTAB Abdomen: Soft, mild +tender, nondistended, bowel sounds present Musculoskeletal: No bilateral pedal edema noted Skin: Normal Psychiatry: Normal mood   Discharge Instructions You were cared for by a hospitalist during your hospital stay. If you have any questions about your discharge medications or the care you received while you were in the hospital after you are discharged, you can call the unit and asked to speak with the hospitalist on call if the hospitalist that took care of you is not available. Once you are discharged, your primary care physician will handle any further medical issues. Please note that NO REFILLS for any discharge medications will be authorized once you are discharged, as it is imperative that you return to your primary care physician (or establish a relationship with a primary care physician if you do not have one) for your aftercare needs so that they can reassess your need for medications and monitor your lab values.  Discharge Instructions     Ambulatory referral to Hematology / Oncology   Complete by:  As directed       Allergies as of 12/04/2021       Reactions   Penicillins Itching        Medication List     STOP taking these medications    naproxen 500 MG tablet Commonly known as: NAPROSYN       TAKE these medications    acetaminophen 650 MG CR tablet Commonly known as: Tylenol 8 Hour Take 1 tablet (650 mg total) by mouth every 8 (eight) hours as needed for pain.   Advair Diskus 250-50 MCG/ACT Aepb Generic drug:  fluticasone-salmeterol Inhale 1 puff into the lungs in the morning and at bedtime.   albuterol 108 (90 Base) MCG/ACT inhaler Commonly known as: VENTOLIN HFA Inhale 2 puffs into the lungs every 4 (four) hours as needed for wheezing or shortness of breath.   albuterol (2.5 MG/3ML) 0.083% nebulizer solution Commonly known as: PROVENTIL Take 3 mLs (2.5 mg total) by nebulization every 4 (four) hours as needed for wheezing.   Amitiza 24 MCG capsule Generic drug: lubiprostone Take 24 mcg by mouth 2 (two) times daily as needed for constipation.   ferrous sulfate 325 (65 FE) MG tablet Take 1 tablet (325 mg total) by mouth daily with breakfast. Start taking on: December 05, 2021   fluconazole 200 MG tablet Commonly known as: DIFLUCAN Take 2 tablets (400 mg total) by mouth daily for 12 days.   fluticasone 50 MCG/ACT nasal spray Commonly known as: FLONASE Place 2 sprays into both nostrils daily. What changed:  when to take this reasons to take this   furosemide 20 MG tablet Commonly known as: LASIX Take 20 mg by mouth daily.   gabapentin 800 MG tablet Commonly known as: NEURONTIN Take 800 mg by mouth 3 (three) times daily.   loratadine 10 MG tablet Commonly known as: CLARITIN Take 10 mg by mouth daily as needed for allergies.   naloxone 4 MG/0.1ML Liqd nasal spray kit Commonly known as: NARCAN Place 1 spray into the nose as needed (accidental overdose).   nystatin 100000 UNIT/ML suspension Commonly known as: MYCOSTATIN Take 5 mLs (500,000 Units total) by mouth 4 (four) times daily.   omeprazole 40 MG capsule Commonly known as: PRILOSEC Take 40 mg by mouth daily.   ondansetron 4 MG disintegrating tablet Commonly known as: Zofran ODT Take 1 tablet (4 mg total) by mouth every 8 (eight) hours as needed for nausea or vomiting.   oxyCODONE 15 MG immediate release tablet Commonly known as: ROXICODONE Take 1 tablet (15 mg total) by mouth 4 (four) times daily as needed for  pain.   phentermine 37.5 MG tablet Commonly known as: ADIPEX-P Take 37.5 mg by mouth daily.   sertraline 100 MG tablet Commonly known as: ZOLOFT Take 100 mg by mouth daily.   traZODone 50 MG tablet Commonly known as: DESYREL Take 50 mg by mouth at bedtime as needed for sleep.   Trulicity 1.5 TI/1.4ER Sopn Generic drug: Dulaglutide Inject 1.5 mg into the skin every Friday.   Vitamin D (Ergocalciferol) 1.25 MG (50000 UNIT) Caps capsule Commonly known as: DRISDOL Take 50,000 Units by mouth every Sunday.       Allergies  Allergen Reactions   Penicillins Itching    Follow-up [place]. Schedule an appointment as soon as possible for a visit in 1 week(s).   Contact information: [place] [id number] [phone]         Benay Pike, MD  Follow up.   Specialty: Hematology and Oncology Why: Office will call to make an appointment for your anemia Contact information: [place] [address] [phone]                  The results of significant diagnostics from this hospitalization (including imaging, microbiology, ancillary and laboratory) are listed below for reference.    Significant Diagnostic Studies: DG Chest 2 View  Result Date: 12/01/2021 CLINICAL DATA:  Shortness of breath and recent choking episode EXAM: CHEST - 2 VIEW COMPARISON:  02/27/2017 FINDINGS: The heart size and mediastinal contours are within normal limits. Both lungs are clear. The visualized skeletal structures are unremarkable. IMPRESSION: No active cardiopulmonary disease. Electronically Signed   By: Inez Catalina M.D.   On: 12/01/2021 03:35   CT ABDOMEN PELVIS W CONTRAST  Result Date: 12/01/2021 CLINICAL DATA:  Chronic anemia. Epigastric pain. History of hernia repair. EXAM: CT ABDOMEN AND PELVIS WITH CONTRAST TECHNIQUE: Multidetector CT imaging of the abdomen and pelvis was performed using the standard protocol  following bolus administration of intravenous contrast. RADIATION DOSE REDUCTION: This exam was performed according to the departmental dose-optimization program which includes automated exposure control, adjustment of the mA and/or kV according to patient size and/or use of iterative reconstruction technique. CONTRAST:  15m OMNIPAQUE IOHEXOL 300 MG/ML  SOLN COMPARISON:  02/05/2020 FINDINGS: Lower chest: Unremarkable. Hepatobiliary: No suspicious focal abnormality within the liver parenchyma. There is no evidence for gallstones, gallbladder wall thickening, or pericholecystic fluid. No intrahepatic or extrahepatic biliary dilation. Pancreas: No focal mass lesion. No dilatation of the main duct. No intraparenchymal cyst. No peripancreatic edema. Spleen: No splenomegaly. No focal mass lesion. Adrenals/Urinary Tract: No adrenal nodule or mass. Kidneys unremarkable. No evidence for hydroureter. Bladder is distended. Stomach/Bowel: Stomach is unremarkable. No gastric wall thickening. No evidence of outlet obstruction. Duodenum is normally positioned as is the ligament of Treitz. No small bowel wall thickening. No small bowel dilatation. The terminal ileum is normal. The appendix is not well visualized, but there is no edema or inflammation in the region of the cecum. No gross colonic mass. No colonic wall thickening. Vascular/Lymphatic: No abdominal aortic aneurysm. No abdominal aortic atherosclerotic calcification. There is no gastrohepatic or hepatoduodenal ligament lymphadenopathy. No retroperitoneal or mesenteric lymphadenopathy. Clustered small lymph nodes in the ileocolic mesentery are stable in the interval consistent with reactive etiology. No pelvic sidewall lymphadenopathy. Reproductive: The uterus is unremarkable.  There is no adnexal mass. Other: No intraperitoneal free fluid. Musculoskeletal: Ventral mesh again identified with midline bulging component, similar to prior. 5.6 x 1.6 x 4.5 cm crescent shaped  rim enhancing collection of fluid is identified along the superficial aspect of the mesh, along the midline rectus sheath. Small bowel adhesions are seen immediately deep to the fascia/mesh in the superficial collection resembles the small bowel loops seen deep to the collection. No small bowel can be seen tracking through the mesh into the crescent shaped collection to suggest that this represents a small bowel loop. No worrisome lytic or sclerotic osseous abnormality. IMPRESSION: 1. 5.6 x 1.6 x 4.5 cm crescent shaped rim enhancing collection of fluid along the superficial aspect of the midline bulging ventral mesh, at the midline rectus sheath. Small bowel loops are seen immediately deep to the mesh and have similar attenuation fluid. No bowel loop can be seen traversing the fascia/mash to suggest that the superficial collection represents a herniated small bowel loop. As such, the superficial collection is favored to represent a  discrete rim enhancing fluid collection, possibly abscess. Herniated small bowel loop superficial to the mesh is considered less likely but cannot be entirely excluded by imaging. If clinical picture is equivocal, repeat CT after administration of enteric contrast may prove helpful. 2. Small bowel adhesions are seen immediately deep to the fascia/mesh in the superf no evidence for small bowel obstruction. No intraperitoneal free fluid. 3. Distended urinary bladder. Correlation for retention recommended. Electronically Signed   By: Misty Stanley M.D.   On: 12/01/2021 10:56   VAS [place] LOWER EXTREMITY VENOUS (DVT)  Result Date: 12/02/2021  Lower Venous DVT Study Patient Name:  [name]  Date of Exam:   12/02/2021 Medical Rec #: [id number]          Accession #:    [id number] Date of Birth: [birth year]          Patient Gender: F Patient Age:   69 years Exam Location[facility]  [facility] Procedure:      VAS [place] LOWER EXTREMITY VENOUS (DVT) Referring Phys: Gevena Barre  --------------------------------------------------------------------------------  Indications: Pain.  Comparison Study: no prior Performing Technologist: Archie Patten RVS  Examination Guidelines: A complete evaluation includes B-mode imaging, spectral Doppler, color Doppler, and power Doppler as needed of all accessible portions of each vessel. Bilateral testing is considered an integral part of a complete examination. Limited examinations for reoccurring indications may be performed as noted. The reflux portion of the exam is performed with the patient in reverse Trendelenburg.  +-----+---------------+---------+-----------+----------+--------------+  RIGHT Compressibility Phasicity Spontaneity Properties Thrombus Aging  +-----+---------------+---------+-----------+----------+--------------+  CFV   Full            Yes       Yes                                    +-----+---------------+---------+-----------+----------+--------------+   +---------+---------------+---------+-----------+----------+-------------------+  LEFT      Compressibility Phasicity Spontaneity Properties Thrombus Aging       +---------+---------------+---------+-----------+----------+-------------------+  CFV       Full            Yes       Yes                                         +---------+---------------+---------+-----------+----------+-------------------+  SFJ       Full                                                                  +---------+---------------+---------+-----------+----------+-------------------+  FV Prox   Full                                                                  +---------+---------------+---------+-----------+----------+-------------------+  FV Mid    Full                                                                  +---------+---------------+---------+-----------+----------+-------------------+  FV Distal Full                                                                   +---------+---------------+---------+-----------+----------+-------------------+  PFV       Full                                                                  +---------+---------------+---------+-----------+----------+-------------------+  POP       Full            Yes       Yes                                         +---------+---------------+---------+-----------+----------+-------------------+  PTV       Full                                                                  +---------+---------------+---------+-----------+----------+-------------------+  PERO                                                       Not well visualized  +---------+---------------+---------+-----------+----------+-------------------+     Summary: RIGHT: - No evidence of common femoral vein obstruction.  LEFT: - There is no evidence of deep vein thrombosis in the lower extremity.  - No cystic structure found in the popliteal fossa.  *See table(s) above for measurements and observations. Electronically signed by Orlie Pollen on 12/02/2021 at 11:21:07 AM.    Final     Microbiology: Recent Results (from the past 240 hour(s))  Resp Panel by RT-PCR (Flu A&B, Covid) Nasopharyngeal Swab     Status: None   Collection Time: 12/01/21  7:18 AM   Specimen: Nasopharyngeal Swab; Nasopharyngeal(NP) swabs in vial transport medium  Result Value Ref Range Status   SARS Coronavirus 2 by RT PCR NEGATIVE NEGATIVE Final    Comment: (NOTE) SARS-CoV-2 target nucleic acids are NOT DETECTED.  The SARS-CoV-2 RNA is generally detectable in upper respiratory specimens during the acute phase of infection. The lowest concentration of SARS-CoV-2 viral copies this assay can detect is 138 copies/mL. A negative result does not preclude SARS-Cov-2 infection and should not be used as the sole basis for treatment or other patient management decisions. A negative result may occur with  improper specimen collection/handling, submission of specimen  other than nasopharyngeal swab, presence of viral mutation(s) within the areas targeted by this assay, and inadequate number of viral copies(<138 copies/mL). A negative result must be combined with clinical observations, patient history, and epidemiological information. The expected result is Negative.  Fact Sheet  for Patients:  EntrepreneurPulse.com.au  Fact Sheet for Healthcare Providers:  IncredibleEmployment.be  This test is no t yet approved or cleared by the [place] FDA and  has been authorized for detection and/or diagnosis of SARS-CoV-2 by FDA under an Emergency Use Authorization (EUA). This EUA will remain  in effect (meaning this test can be used) for the duration of the COVID-19 declaration under Section 564(b)(1) of the Act, 21 U.S.C.section 360bbb-3(b)(1), unless the authorization is terminated  or revoked sooner.       Influenza A by PCR NEGATIVE NEGATIVE Final   Influenza B by PCR NEGATIVE NEGATIVE Final    Comment: (NOTE) The Xpert Xpress SARS-CoV-2/FLU/RSV plus assay is intended as an aid in the diagnosis of influenza from Nasopharyngeal swab specimens and should not be used as a sole basis for treatment. Nasal washings and aspirates are unacceptable for Xpert Xpress SARS-CoV-2/FLU/RSV testing.  Fact Sheet for Patients: EntrepreneurPulse.com.au  Fact Sheet for Healthcare Providers: IncredibleEmployment.be  This test is not yet approved or cleared by the [place] FDA and has been authorized for detection and/or diagnosis of SARS-CoV-2 by FDA under an Emergency Use Authorization (EUA). This EUA will remain in effect (meaning this test can be used) for the duration of the COVID-19 declaration under Section 564(b)(1) of the Act, 21 U.S.C. section 360bbb-3(b)(1), unless the authorization is terminated or revoked.  Performed at [facility], [place] [address] [place],  [place] [address]   MRSA Next Gen by PCR, Nasal     Status: None   Collection Time: 12/02/21 12:01 PM   Specimen: Nasal Mucosa; Nasal Swab  Result Value Ref Range Status   MRSA by PCR Next Gen NOT DETECTED NOT DETECTED Final    Comment: (NOTE) The GeneXpert MRSA Assay (FDA approved for NASAL specimens only), is one component of a comprehensive MRSA colonization surveillance program. It is not intended to diagnose MRSA infection nor to guide or monitor treatment for MRSA infections. Test performance is not FDA approved in patients less than 83 years old. Performed at [facility], [place] [address], [place], [place] [address]      Labs: Basic Metabolic Panel: Recent Labs  Lab 12/01/21 0136 12/01/21 0834 12/02/21 0419 12/03/21 0041 12/04/21 0422  NA 138 141 139 135 135  K 3.5 3.4* 3.6 3.3* 4.5  CL 106 105 106 102 104  CO2 _0 GLUCOSE 91 114* 87 101* 87  BUN <5* <5* <5* 15 14  CREATININE 0.57 0.64 0.66 1.11* 0.93  CALCIUM 8.9 9.3 9.0 8.6* 8.7*  MG  --   --   --   --  2.1   Liver Function Tests: Recent Labs  Lab 12/01/21 0136 12/02/21 0419  AST 19 16  ALT 11 10  ALKPHOS 67 65  BILITOT 0.1* <0.1*  PROT 7.1 6.7  ALBUMIN 3.2* 3.0*   No results for input(s): LIPASE, AMYLASE in the last 168 hours. No results for input(s): AMMONIA in the last 168 hours. CBC: Recent Labs  Lab 12/01/21 0136 12/01/21 0834 12/02/21 0419 12/03/21 0041 12/03/21 0800 12/03/21 2108 12/04/21 0422  WBC 5.8 5.8 5.8 8.7  --   --  8.0  NEUTROABS 3.1  --  2.4 4.0  --   --  5.6  HGB 5.7* 7.1* 7.0* 6.6* 6.5* 9.3* 8.2*  HCT 21.3* 26.5* 24.9* 23.4* 22.8* 31.7* 28.2*  MCV 69.4* 72.0* 69.9* 70.9*  --   --  75.2*  PLT 338 358 371  362  --   --  334   Cardiac Enzymes: No results for input(s): CKTOTAL, CKMB, CKMBINDEX, TROPONINI in the last 168 hours. BNP: BNP (last 3 results) No results for input(s): BNP in the last 8760 hours.  ProBNP (last 3 results) No results for input(s): PROBNP  in the last 8760 hours.  CBG: Recent Labs  Lab 12/02/21 0915  GLUCAP 101*       Signed:  Alma Friendly, MD [facility] 12/04/2021, 1:38 PM

## 2021-12-04 NOTE — Consult Note (Signed)
Consult Note  [name] May 21, 1971  [id number].    Requesting MD: Lesia Sago, MD Chief Complaint/Reason for Consult: Hx of ventral hernia repair with abnormal CT scan  HPI:  Patient is a 51 year old female currently admitted with several months of progressively worsening dysphagia and odynophagia. She showed up after a choking episode 1/19. Hx of esophageal stricture s/p dilation. GI was consulted and did EGD - patient noted to have Nayzeth esophagitis and gastritis. GI medically managing this. Patient also has hx of 2 prior ventral hernia repair most recently in 2017 with mesh. This was done at [facility] by Dr. Raul Del. She reports pain at hernia site, this pain has been the same for several months. When she had gone back and seen him she was told that she had an infection and possible recurrent hernia.   PMHx otherwise significant for HTN, T2DM, asthma, back pain. Other previous abdominal surgery includes tubal ligation. Allergic to PCNs.   ROS: Review of Systems  Constitutional:  Negative for chills and fever.  HENT:         Dental pain   Respiratory:  Negative for shortness of breath.   Cardiovascular:  Negative for chest pain.  Gastrointestinal:  Positive for abdominal pain. Negative for nausea and vomiting.  Genitourinary:  Negative for dysuria, frequency and urgency.  Musculoskeletal:  Positive for back pain (chronic) and myalgias (BLE pain).  All other systems reviewed and are negative.  Family History  Problem Relation Age of Onset   Kidney failure Mother    Heart attack Father    Breast cancer Neg Hx     Past Medical History:  Diagnosis Date   Asthma    Back pain    Diabetes mellitus without complication ([place])    Hypertension     Past Surgical History:  Procedure Laterality Date   FINGER SURGERY  2011   HERNIA REPAIR     INCISION AND DRAINAGE ABSCESS Left 05/06/2017   Procedure: INCISION AND DRAINAGE ABSCESS;  Surgeon: Florian Buff,  MD;  Location: [facility];  Service: Gynecology;  Laterality: Left;   TUBAL LIGATION  2004    Social History:  reports that she has never smoked. She has never used smokeless tobacco. She reports that she does not drink alcohol and does not use drugs.  Allergies:  Allergies  Allergen Reactions   Penicillins Itching    Medications Prior to Admission  Medication Sig Dispense Refill   acetaminophen (TYLENOL 8 HOUR) 650 MG CR tablet Take 1 tablet (650 mg total) by mouth every 8 (eight) hours as needed for pain. 30 tablet 0   ADVAIR DISKUS 250-50 MCG/DOSE AEPB Inhale 1 puff into the lungs in the morning and at bedtime.  3   albuterol (PROVENTIL HFA;VENTOLIN HFA) 108 (90 BASE) MCG/ACT inhaler Inhale 2 puffs into the lungs every 4 (four) hours as needed for wheezing or shortness of breath. 1 Inhaler 0   AMITIZA 24 MCG capsule Take 24 mcg by mouth 2 (two) times daily as needed for constipation.  6   fluticasone (FLONASE) 50 MCG/ACT nasal spray Place 2 sprays into both nostrils daily. (Patient taking differently: Place 2 sprays into both nostrils daily as needed for allergies.) 16 g 2   furosemide (LASIX) 20 MG tablet Take 20 mg by mouth daily.     gabapentin (NEURONTIN) 800 MG tablet Take 800 mg by mouth 3 (three) times daily.  3   loratadine (CLARITIN) 10 MG tablet  Take 10 mg by mouth daily as needed for allergies.     naloxone (NARCAN) nasal spray 4 mg/0.1 mL Place 1 spray into the nose as needed (accidental overdose).     naproxen (NAPROSYN) 500 MG tablet Take 500 mg by mouth 2 (two) times daily.     omeprazole (PRILOSEC) 40 MG capsule Take 40 mg by mouth daily.  3   ondansetron (ZOFRAN ODT) 4 MG disintegrating tablet Take 1 tablet (4 mg total) by mouth every 8 (eight) hours as needed for nausea or vomiting. 20 tablet 0   oxyCODONE (ROXICODONE) 15 MG immediate release tablet Take 1 tablet (15 mg total) by mouth 4 (four) times daily as needed for pain. 30 tablet 0   phentermine (ADIPEX-P) 37.5 MG  tablet Take 37.5 mg by mouth daily.     sertraline (ZOLOFT) 100 MG tablet Take 100 mg by mouth daily.  3   traZODone (DESYREL) 50 MG tablet Take 50 mg by mouth at bedtime as needed for sleep.      TRULICITY 1.5 0000000 SOPN Inject 1.5 mg into the skin every Friday.     Vitamin D, Ergocalciferol, (DRISDOL) 50000 units CAPS capsule Take 50,000 Units by mouth every Sunday.  5   albuterol (PROVENTIL) (2.5 MG/3ML) 0.083% nebulizer solution Take 3 mLs (2.5 mg total) by nebulization every 4 (four) hours as needed for wheezing. (Patient not taking: Reported on 12/01/2021) 75 mL 0    Blood pressure (!) 119/55, pulse 84, temperature 99 F (37.2 C), temperature source Oral, resp. rate 18, height 5' (1.524 m), weight 76.9 kg, SpO2 93 %. Physical Exam:  General: pleasant, WD, overweight female who is laying in bed in NAD HEENT: head is normocephalic, atraumatic.  Sclera are anicteric. Pupils are equal and round.  Ears and nose without any masses or lesions.  Mouth is pink and moist Heart: regular, rate, and rhythm.  Normal s1,s2. No obvious murmurs, gallops, or rubs noted.  Palpable radial and pedal pulses bilaterally Lungs: CTAB, no wheezes, rhonchi, or rales noted.  Respiratory effort nonlabored Abd: soft, mild ttp over epigastric abdomen without peritonitis, ND, +BS, no masses, hernias, or organomegaly MS: all 4 extremities are symmetrical with no cyanosis, clubbing, or edema. Skin: warm and dry with no masses, lesions, or rashes Neuro: Cranial nerves 2-12 grossly intact, sensation is normal throughout Psych: A&Ox3 with an appropriate affect.   Results for orders placed or performed during the hospital encounter of 12/01/21 (from the past 48 hour(s))  MRSA Next Gen by PCR, Nasal     Status: None   Collection Time: 12/02/21 12:01 PM   Specimen: Nasal Mucosa; Nasal Swab  Result Value Ref Range   MRSA by PCR Next Gen NOT DETECTED NOT DETECTED    Comment: (NOTE) The GeneXpert MRSA Assay (FDA approved  for NASAL specimens only), is one component of a comprehensive MRSA colonization surveillance program. It is not intended to diagnose MRSA infection nor to guide or monitor treatment for MRSA infections. Test performance is not FDA approved in patients less than 19 years old. Performed at [facility], [place] [address], [place], [place] [address]   CBC with Differential/Platelet     Status: Abnormal   Collection Time: 12/03/21 12:41 AM  Result Value Ref Range   WBC 8.7 4.0 - 10.5 K/uL   RBC 3.30 (L) 3.87 - 5.11 MIL/uL   Hemoglobin 6.6 (LL) 12.0 - 15.0 g/dL    Comment: REPEATED TO VERIFY Reticulocyte Hemoglobin testing may be clinically  indicated, consider ordering this additional test [id number] THIS CRITICAL RESULT HAS VERIFIED AND BEEN CALLED TO A [place] RN BY CANDACE HAYES ON 01 21 2023 AT 0129, AND HAS BEEN READ BACK.     HCT 23.4 (L) 36.0 - 46.0 %   MCV 70.9 (L) 80.0 - 100.0 fL   MCH 20.0 (L) 26.0 - 34.0 pg   MCHC 28.2 (L) 30.0 - 36.0 g/dL   RDW 22.8 (H) 11.5 - 15.5 %   Platelets 362 150 - 400 K/uL   nRBC 0.2 0.0 - 0.2 %   Neutrophils Relative % 46 %   Neutro Abs 4.0 1.7 - 7.7 K/uL   Lymphocytes Relative 41 %   Lymphs Abs 3.6 0.7 - 4.0 K/uL   Monocytes Relative 9 %   Monocytes Absolute 0.8 0.1 - 1.0 K/uL   Eosinophils Relative 3 %   Eosinophils Absolute 0.2 0.0 - 0.5 K/uL   Basophils Relative 1 %   Basophils Absolute 0.0 0.0 - 0.1 K/uL   Immature Granulocytes 0 %   Abs Immature Granulocytes 0.03 0.00 - 0.07 K/uL    Comment: Performed at [facility] [address] [place], [place] [id number]  Basic metabolic panel     Status: Abnormal   Collection Time: 12/03/21 12:41 AM  Result Value Ref Range   Sodium 135 135 - 145 mmol/L   Potassium 3.3 (L) 3.5 - 5.1 mmol/L   Chloride 102 98 - 111 mmol/L   CO2 25 22 - 32 mmol/L   Glucose, Bld 101 (H) 70 - 99 mg/dL    Comment: Glucose reference range applies only to samples taken after fasting for at least 8 hours.    BUN 15 6 - 20 mg/dL   Creatinine, Ser 1.11 (H) 0.44 - 1.00 mg/dL   Calcium 8.6 (L) 8.9 - 10.3 mg/dL   GFR, Estimated >60 >60 mL/min    Comment: (NOTE) Calculated using the CKD-EPI Creatinine Equation (2021)    Anion gap 8 5 - 15    Comment: Performed at [facility] [address] [place], [place] [address]  Hemoglobin and hematocrit, blood     Status: Abnormal   Collection Time: 12/03/21  8:00 AM  Result Value Ref Range   Hemoglobin 6.5 (LL) 12.0 - 15.0 g/dL    Comment: REPEATED TO VERIFY THIS CRITICAL RESULT HAS VERIFIED AND BEEN CALLED TO RN,PATRICIA MAYERS BY NAZERA SHAMA ON 01 21 2023 AT 0824, AND HAS BEEN READ BACK.     HCT 22.8 (L) 36.0 - 46.0 %    Comment: Performed at [facility], [place] [address], [place], [place] [address]  Prepare RBC (crossmatch)     Status: None   Collection Time: 12/03/21  8:05 AM  Result Value Ref Range   Order Confirmation      ORDER PROCESSED BY BLOOD BANK Performed at [facility], [place] [address], [place], [place] [address]   Hemoglobin and hematocrit, blood     Status: Abnormal   Collection Time: 12/03/21  9:08 PM  Result Value Ref Range   Hemoglobin 9.3 (L) 12.0 - 15.0 g/dL    Comment: REPEATED TO VERIFY POST TRANSFUSION SPECIMEN    HCT 31.7 (L) 36.0 - 46.0 %    Comment: Performed at [facility] [address], [place], [place] [address]  CBC with Differential/Platelet     Status: Abnormal   Collection Time: 12/04/21  4:22 AM  Result Value Ref Range  WBC 8.0 4.0 - 10.5 K/uL   RBC 3.75 (L) 3.87 - 5.11 MIL/uL   Hemoglobin 8.2 (L) 12.0 - 15.0 g/dL    Comment: Reticulocyte Hemoglobin testing may be clinically indicated, consider ordering this additional test [id number]    HCT 28.2 (L) 36.0 - 46.0 %   MCV 75.2 (L) 80.0 - 100.0 fL   MCH 21.9 (L) 26.0 - 34.0 pg   MCHC 29.1 (L) 30.0 - 36.0 g/dL   RDW 23.8 (H) 11.5 - 15.5 %   Platelets 334 150 - 400 K/uL   nRBC 0.0 0.0 - 0.2 %   Neutrophils Relative % 69 %    Neutro Abs 5.6 1.7 - 7.7 K/uL   Lymphocytes Relative 19 %   Lymphs Abs 1.5 0.7 - 4.0 K/uL   Monocytes Relative 8 %   Monocytes Absolute 0.6 0.1 - 1.0 K/uL   Eosinophils Relative 2 %   Eosinophils Absolute 0.2 0.0 - 0.5 K/uL   Basophils Relative 1 %   Basophils Absolute 0.0 0.0 - 0.1 K/uL   Immature Granulocytes 1 %   Abs Immature Granulocytes 0.05 0.00 - 0.07 K/uL    Comment: Performed at [facility], [address], [place], [place] [id number]  Basic metabolic panel     Status: Abnormal   Collection Time: 12/04/21  4:22 AM  Result Value Ref Range   Sodium 135 135 - 145 mmol/L   Potassium 4.5 3.5 - 5.1 mmol/L    Comment: DELTA CHECK NOTED NO VISIBLE HEMOLYSIS    Chloride 104 98 - 111 mmol/L   CO2 26 22 - 32 mmol/L   Glucose, Bld 87 70 - 99 mg/dL    Comment: Glucose reference range applies only to samples taken after fasting for at least 8 hours.   BUN 14 6 - 20 mg/dL   Creatinine, Ser 0.93 0.44 - 1.00 mg/dL   Calcium 8.7 (L) 8.9 - 10.3 mg/dL   GFR, Estimated >60 >60 mL/min    Comment: (NOTE) Calculated using the CKD-EPI Creatinine Equation (2021)    Anion gap 5 5 - 15    Comment: Performed at [facility] [address], [place], [place] [address]  Magnesium     Status: None   Collection Time: 12/04/21  4:22 AM  Result Value Ref Range   Magnesium 2.1 1.7 - 2.4 mg/dL    Comment: Performed at [facility] [address], [place], [place] [address]   VAS [place] LOWER EXTREMITY VENOUS (DVT)  Result Date: 12/02/2021  Lower Venous DVT Study Patient Name:  TRISTI ECONOMY  Date of Exam:   12/02/2021 Medical Rec #: [id number]          Accession #:    [id number] Date of Birth: [birth year]          Patient Gender: F Patient Age:   74 years Exam Location:  [facility] Procedure:      VAS [place] LOWER EXTREMITY VENOUS (DVT) Referring Phys: Gevena Barre --------------------------------------------------------------------------------  Indications: Pain.  Comparison  Study: no prior Performing Technologist: Archie Patten RVS  Examination Guidelines: A complete evaluation includes B-mode imaging, spectral Doppler, color Doppler, and power Doppler as needed of all accessible portions of each vessel. Bilateral testing is considered an integral part of a complete examination. Limited examinations for reoccurring indications may be performed as noted. The reflux portion of the exam is performed with the patient in reverse Trendelenburg.  +-----+---------------+---------+-----------+----------+--------------+  RIGHT Compressibility Phasicity Spontaneity Properties  Thrombus Aging  +-----+---------------+---------+-----------+----------+--------------+  CFV   Full            Yes       Yes                                    +-----+---------------+---------+-----------+----------+--------------+   +---------+---------------+---------+-----------+----------+-------------------+  LEFT      Compressibility Phasicity Spontaneity Properties Thrombus Aging       +---------+---------------+---------+-----------+----------+-------------------+  CFV       Full            Yes       Yes                                         +---------+---------------+---------+-----------+----------+-------------------+  SFJ       Full                                                                  +---------+---------------+---------+-----------+----------+-------------------+  FV Prox   Full                                                                  +---------+---------------+---------+-----------+----------+-------------------+  FV Mid    Full                                                                  +---------+---------------+---------+-----------+----------+-------------------+  FV Distal Full                                                                  +---------+---------------+---------+-----------+----------+-------------------+  PFV       Full                                                                   +---------+---------------+---------+-----------+----------+-------------------+  POP       Full            Yes       Yes                                         +---------+---------------+---------+-----------+----------+-------------------+  PTV       Full                                                                  +---------+---------------+---------+-----------+----------+-------------------+  PERO                                                       Not well visualized  +---------+---------------+---------+-----------+----------+-------------------+     Summary: RIGHT: - No evidence of common femoral vein obstruction.  LEFT: - There is no evidence of deep vein thrombosis in the lower extremity.  - No cystic structure found in the popliteal fossa.  *See table(s) above for measurements and observations. Electronically signed by Orlie Pollen on 12/02/2021 at 11:21:07 AM.    Final       Assessment/Plan Hx of ventral hernia repair x2, most recently in 2017 with mesh  CT with 5.6 x 1.6 x 4.5 cm crescent shaped collection  - CT reviewed with attending provider and collection looks more like chronic seroma. She does not appear to have recurrent hernia but rather eventration of mesh.  - no leukocytosis and afebrile, no skin changes overlying  - no indication for acute drainage or surgical intervention at this time - recommend outpatient follow up with original surgeon - general surgery will sign off   I reviewed hospitalist notes, last 24 h vitals and pain scores, last 48 h intake and output, last 24 h labs and trends, and last 24 h imaging results.  This care required moderate level of medical decision making.   Norm Parcel, [facility] 12/04/2021, 9:31 AM Please see Amion for pager number during day hours 7:00am-4:30pm

## 2021-12-05 ENCOUNTER — Encounter (HOSPITAL_COMMUNITY): Payer: Self-pay | Admitting: Gastroenterology

## 2021-12-05 LAB — HEMOGLOBIN A1C
Hgb A1c MFr Bld: 5.3 % (ref 4.8–5.6)
Mean Plasma Glucose: 105 mg/dL

## 2021-12-06 LAB — SURGICAL PATHOLOGY

## 2021-12-07 ENCOUNTER — Encounter: Payer: Self-pay | Admitting: Gastroenterology

## 2023-03-15 ENCOUNTER — Other Ambulatory Visit: Payer: Self-pay | Admitting: Internal Medicine

## 2023-03-15 DIAGNOSIS — Z1231 Encounter for screening mammogram for malignant neoplasm of breast: Secondary | ICD-10-CM

## 2023-04-17 ENCOUNTER — Ambulatory Visit: Payer: 59
